# Patient Record
Sex: Male | Born: 1935 | Race: White | Hispanic: No | Marital: Married | Smoking: Never smoker
Health system: Southern US, Community
[De-identification: ages and names within clinical notes are randomized; demographics above are authoritative.]

## PROBLEM LIST (undated history)

## (undated) DIAGNOSIS — F329 Major depressive disorder, single episode, unspecified: Secondary | ICD-10-CM

## (undated) DIAGNOSIS — I2510 Atherosclerotic heart disease of native coronary artery without angina pectoris: Secondary | ICD-10-CM

## (undated) DIAGNOSIS — R350 Frequency of micturition: Secondary | ICD-10-CM

## (undated) DIAGNOSIS — J189 Pneumonia, unspecified organism: Secondary | ICD-10-CM

## (undated) DIAGNOSIS — Z8709 Personal history of other diseases of the respiratory system: Secondary | ICD-10-CM

## (undated) DIAGNOSIS — D649 Anemia, unspecified: Secondary | ICD-10-CM

## (undated) DIAGNOSIS — E785 Hyperlipidemia, unspecified: Secondary | ICD-10-CM

## (undated) DIAGNOSIS — F32A Depression, unspecified: Secondary | ICD-10-CM

## (undated) HISTORY — PX: EYE SURGERY: SHX253

## (undated) HISTORY — PX: TONSILLECTOMY: SUR1361

## (undated) HISTORY — DX: Hyperlipidemia, unspecified: E78.5

## (undated) HISTORY — PX: CARDIAC CATHETERIZATION: SHX172

## (undated) HISTORY — DX: Anemia, unspecified: D64.9

## (undated) HISTORY — PX: OTHER SURGICAL HISTORY: SHX169

---

## 2000-04-01 ENCOUNTER — Ambulatory Visit (HOSPITAL_COMMUNITY): Admission: RE | Admit: 2000-04-01 | Discharge: 2000-04-01 | Payer: Self-pay | Admitting: Gastroenterology

## 2003-05-18 ENCOUNTER — Ambulatory Visit (HOSPITAL_COMMUNITY): Admission: RE | Admit: 2003-05-18 | Discharge: 2003-05-18 | Payer: Self-pay | Admitting: Gastroenterology

## 2004-05-07 ENCOUNTER — Inpatient Hospital Stay (HOSPITAL_COMMUNITY): Admission: RE | Admit: 2004-05-07 | Discharge: 2004-05-08 | Payer: Self-pay | Admitting: Orthopedic Surgery

## 2011-02-16 ENCOUNTER — Other Ambulatory Visit: Payer: Self-pay | Admitting: Dermatology

## 2011-03-05 ENCOUNTER — Other Ambulatory Visit: Payer: Self-pay | Admitting: Dermatology

## 2011-05-19 ENCOUNTER — Other Ambulatory Visit: Payer: Self-pay | Admitting: Interventional Cardiology

## 2011-05-21 ENCOUNTER — Encounter (HOSPITAL_BASED_OUTPATIENT_CLINIC_OR_DEPARTMENT_OTHER)
Admission: RE | Disposition: A | Discharge status: Hospice | Payer: Self-pay | Source: Ambulatory Visit | Attending: Interventional Cardiology

## 2011-05-21 ENCOUNTER — Inpatient Hospital Stay (HOSPITAL_BASED_OUTPATIENT_CLINIC_OR_DEPARTMENT_OTHER)
Admission: RE | Admit: 2011-05-21 | Discharge: 2011-05-21 | Disposition: A | Discharge status: Hospice | Payer: Medicare Other | Source: Ambulatory Visit | Attending: Interventional Cardiology | Admitting: Interventional Cardiology

## 2011-05-21 ENCOUNTER — Inpatient Hospital Stay (HOSPITAL_COMMUNITY): Payer: Medicare Other

## 2011-05-21 ENCOUNTER — Encounter (HOSPITAL_COMMUNITY): Payer: Self-pay | Admitting: *Deleted

## 2011-05-21 ENCOUNTER — Other Ambulatory Visit: Payer: Self-pay

## 2011-05-21 ENCOUNTER — Inpatient Hospital Stay (HOSPITAL_COMMUNITY)
Admission: AD | Admit: 2011-05-21 | Discharge: 2011-05-27 | DRG: 236 | Disposition: A | Discharge status: Home / Self Care | Payer: Medicare Other | Source: Ambulatory Visit | Attending: Cardiothoracic Surgery | Admitting: Cardiothoracic Surgery

## 2011-05-21 DIAGNOSIS — Z79899 Other long term (current) drug therapy: Secondary | ICD-10-CM

## 2011-05-21 DIAGNOSIS — E8779 Other fluid overload: Secondary | ICD-10-CM | POA: Diagnosis not present

## 2011-05-21 DIAGNOSIS — E7800 Pure hypercholesterolemia, unspecified: Secondary | ICD-10-CM | POA: Diagnosis present

## 2011-05-21 DIAGNOSIS — I209 Angina pectoris, unspecified: Secondary | ICD-10-CM | POA: Diagnosis present

## 2011-05-21 DIAGNOSIS — N400 Enlarged prostate without lower urinary tract symptoms: Secondary | ICD-10-CM | POA: Diagnosis present

## 2011-05-21 DIAGNOSIS — E871 Hypo-osmolality and hyponatremia: Secondary | ICD-10-CM | POA: Diagnosis present

## 2011-05-21 DIAGNOSIS — D696 Thrombocytopenia, unspecified: Secondary | ICD-10-CM | POA: Diagnosis not present

## 2011-05-21 DIAGNOSIS — I2510 Atherosclerotic heart disease of native coronary artery without angina pectoris: Secondary | ICD-10-CM | POA: Insufficient documentation

## 2011-05-21 DIAGNOSIS — F3289 Other specified depressive episodes: Secondary | ICD-10-CM | POA: Diagnosis present

## 2011-05-21 DIAGNOSIS — D62 Acute posthemorrhagic anemia: Secondary | ICD-10-CM | POA: Diagnosis not present

## 2011-05-21 DIAGNOSIS — N529 Male erectile dysfunction, unspecified: Secondary | ICD-10-CM | POA: Diagnosis present

## 2011-05-21 DIAGNOSIS — Z01810 Encounter for preprocedural cardiovascular examination: Secondary | ICD-10-CM

## 2011-05-21 DIAGNOSIS — F329 Major depressive disorder, single episode, unspecified: Secondary | ICD-10-CM | POA: Diagnosis present

## 2011-05-21 DIAGNOSIS — I2584 Coronary atherosclerosis due to calcified coronary lesion: Secondary | ICD-10-CM | POA: Insufficient documentation

## 2011-05-21 DIAGNOSIS — Z7982 Long term (current) use of aspirin: Secondary | ICD-10-CM

## 2011-05-21 HISTORY — DX: Atherosclerotic heart disease of native coronary artery without angina pectoris: I25.10

## 2011-05-21 HISTORY — DX: Major depressive disorder, single episode, unspecified: F32.9

## 2011-05-21 HISTORY — DX: Anemia, unspecified: D64.9

## 2011-05-21 HISTORY — DX: Depression, unspecified: F32.A

## 2011-05-21 LAB — ABO/RH: ABO/RH(D): A POS

## 2011-05-21 SURGERY — JV LEFT HEART CATHETERIZATION WITH CORONARY ANGIOGRAM
Anesthesia: Moderate Sedation

## 2011-05-21 MED ORDER — PHENYLEPHRINE HCL 10 MG/ML IJ SOLN
30.0000 ug/min | INTRAMUSCULAR | Status: DC
  Filled 2011-05-21: qty 2

## 2011-05-21 MED ORDER — SODIUM CHLORIDE 0.9 % IV SOLN
1250.0000 mg | INTRAVENOUS | Status: AC
  Administered 2011-05-22: 1000 mg via INTRAVENOUS
  Filled 2011-05-21: qty 1250

## 2011-05-21 MED ORDER — ALPRAZOLAM 0.25 MG PO TABS: 0.2500 mg | ORAL_TABLET | ORAL | Status: DC | PRN

## 2011-05-21 MED ORDER — SODIUM CHLORIDE 0.9 % IV SOLN: INTRAVENOUS | Status: DC

## 2011-05-21 MED ORDER — ASPIRIN 81 MG PO CHEW: 81.0000 mg | CHEWABLE_TABLET | Freq: Every day | ORAL | Status: DC

## 2011-05-21 MED ORDER — CHLORHEXIDINE GLUCONATE 4 % EX LIQD
60.0000 mL | Freq: Once | CUTANEOUS | Status: AC
  Administered 2011-05-21: 4 via TOPICAL

## 2011-05-21 MED ORDER — ACETAMINOPHEN 325 MG PO TABS: 650.0000 mg | ORAL_TABLET | ORAL | Status: DC | PRN

## 2011-05-21 MED ORDER — POTASSIUM CHLORIDE 2 MEQ/ML IV SOLN
80.0000 meq | INTRAVENOUS | Status: DC
  Filled 2011-05-21: qty 40

## 2011-05-21 MED ORDER — ALPRAZOLAM 0.25 MG PO TABS: 0.2500 mg | ORAL_TABLET | Freq: Two times a day (BID) | ORAL | Status: DC | PRN

## 2011-05-21 MED ORDER — DIAZEPAM 5 MG PO TABS
5.0000 mg | ORAL_TABLET | ORAL | Status: AC
  Administered 2011-05-21: 5 mg via ORAL

## 2011-05-21 MED ORDER — DOPAMINE-DEXTROSE 3.2-5 MG/ML-% IV SOLN: 2.0000 ug/kg/min | INTRAVENOUS | Status: DC

## 2011-05-21 MED ORDER — SODIUM CHLORIDE 0.9 % IV SOLN: 1.0000 mL/kg/h | INTRAVENOUS | Status: DC

## 2011-05-21 MED ORDER — OMEGA-3 FATTY ACIDS 1000 MG PO CAPS
1.0000 g | ORAL_CAPSULE | Freq: Every day | ORAL | Status: DC
  Filled 2011-05-21: qty 1

## 2011-05-21 MED ORDER — NITROGLYCERIN IN D5W 200-5 MCG/ML-% IV SOLN
2.0000 ug/min | INTRAVENOUS | Status: DC
  Filled 2011-05-21: qty 250

## 2011-05-21 MED ORDER — ONDANSETRON HCL 4 MG/2ML IJ SOLN: 4.0000 mg | Freq: Four times a day (QID) | INTRAMUSCULAR | Status: DC | PRN

## 2011-05-21 MED ORDER — DOPAMINE-DEXTROSE 3.2-5 MG/ML-% IV SOLN
2.0000 ug/kg/min | INTRAVENOUS | Status: DC
  Filled 2011-05-21 (×3): qty 250

## 2011-05-21 MED ORDER — EPINEPHRINE HCL 1 MG/ML IJ SOLN
0.5000 ug/min | INTRAVENOUS | Status: DC
  Filled 2011-05-21: qty 4

## 2011-05-21 MED ORDER — CHLORHEXIDINE GLUCONATE 4 % EX LIQD
60.0000 mL | Freq: Once | CUTANEOUS | Status: DC
  Filled 2011-05-21: qty 60

## 2011-05-21 MED ORDER — SODIUM CHLORIDE 0.9 % IJ SOLN: 3.0000 mL | Freq: Two times a day (BID) | INTRAMUSCULAR | Status: DC

## 2011-05-21 MED ORDER — TEMAZEPAM 15 MG PO CAPS
15.0000 mg | ORAL_CAPSULE | Freq: Once | ORAL | Status: AC | PRN
  Administered 2011-05-21: 15 mg via ORAL
  Filled 2011-05-21: qty 1

## 2011-05-21 MED ORDER — FOLIC ACID 1 MG PO TABS
1.0000 mg | ORAL_TABLET | Freq: Every day | ORAL | Status: DC
  Administered 2011-05-23 – 2011-05-27 (×5): 1 mg via ORAL
  Filled 2011-05-21 (×6): qty 1

## 2011-05-21 MED ORDER — METOPROLOL TARTRATE 12.5 MG HALF TABLET
12.5000 mg | ORAL_TABLET | Freq: Once | ORAL | Status: AC
  Administered 2011-05-22: 12.5 mg via ORAL
  Filled 2011-05-21 (×2): qty 1

## 2011-05-21 MED ORDER — SODIUM CHLORIDE 0.9 % IV SOLN: 0.1000 ug/kg/h | INTRAVENOUS | Status: DC

## 2011-05-21 MED ORDER — SODIUM CHLORIDE 0.9 % IJ SOLN: 3.0000 mL | INTRAMUSCULAR | Status: DC | PRN

## 2011-05-21 MED ORDER — NITROGLYCERIN 0.4 MG SL SUBL: 0.4000 mg | SUBLINGUAL_TABLET | SUBLINGUAL | Status: DC | PRN

## 2011-05-21 MED ORDER — MAGNESIUM SULFATE 50 % IJ SOLN
40.0000 meq | INTRAMUSCULAR | Status: DC
  Filled 2011-05-21: qty 10

## 2011-05-21 MED ORDER — PLASMA-LYTE 148 IV SOLN
INTRAVENOUS | Status: AC
  Administered 2011-05-22: 09:00:00
  Filled 2011-05-21: qty 0.5

## 2011-05-21 MED ORDER — SODIUM CHLORIDE 0.9 % IV SOLN
INTRAVENOUS | Status: DC
  Filled 2011-05-21: qty 1

## 2011-05-21 MED ORDER — CHLORHEXIDINE GLUCONATE 4 % EX LIQD
60.0000 mL | Freq: Once | CUTANEOUS | Status: AC
  Administered 2011-05-22: 4 via TOPICAL
  Filled 2011-05-21: qty 60

## 2011-05-21 MED ORDER — FLUOXETINE HCL 20 MG PO CAPS
20.0000 mg | ORAL_CAPSULE | Freq: Every day | ORAL | Status: DC
  Administered 2011-05-23 – 2011-05-27 (×5): 20 mg via ORAL
  Filled 2011-05-21 (×6): qty 1

## 2011-05-21 MED ORDER — ASPIRIN 81 MG PO CHEW
324.0000 mg | CHEWABLE_TABLET | ORAL | Status: AC
  Administered 2011-05-21: 324 mg via ORAL

## 2011-05-21 MED ORDER — MORPHINE SULFATE 2 MG/ML IJ SOLN: 1.0000 mg | INTRAMUSCULAR | Status: DC | PRN

## 2011-05-21 MED ORDER — BISACODYL 5 MG PO TBEC
5.0000 mg | DELAYED_RELEASE_TABLET | Freq: Once | ORAL | Status: DC
  Filled 2011-05-21: qty 1

## 2011-05-21 MED ORDER — ASPIRIN EC 81 MG PO TBEC
81.0000 mg | DELAYED_RELEASE_TABLET | Freq: Every day | ORAL | Status: DC
  Filled 2011-05-21: qty 1

## 2011-05-21 MED ORDER — MOXIFLOXACIN HCL IN NACL 400 MG/250ML IV SOLN
400.0000 mg | INTRAVENOUS | Status: AC
Start: 2011-05-22 — End: 2011-05-22
  Administered 2011-05-22: 400 mg via INTRAVENOUS
  Filled 2011-05-21: qty 250

## 2011-05-21 MED ORDER — SODIUM CHLORIDE 0.9 % IV SOLN
0.1000 ug/kg/h | INTRAVENOUS | Status: DC
  Filled 2011-05-21: qty 4

## 2011-05-21 MED ORDER — SODIUM CHLORIDE 0.9 % IV SOLN
INTRAVENOUS | Status: DC
  Filled 2011-05-21: qty 40

## 2011-05-21 MED ORDER — TAMSULOSIN HCL 0.4 MG PO CAPS
0.4000 mg | ORAL_CAPSULE | Freq: Every day | ORAL | Status: DC
  Filled 2011-05-21 (×2): qty 1

## 2011-05-21 MED ORDER — SODIUM CHLORIDE 0.9 % IV SOLN: 250.0000 mL | INTRAVENOUS | Status: DC | PRN

## 2011-05-21 MED ORDER — ROSUVASTATIN CALCIUM 10 MG PO TABS
10.0000 mg | ORAL_TABLET | Freq: Every day | ORAL | Status: DC
  Administered 2011-05-21 – 2011-05-26 (×5): 10 mg via ORAL
  Filled 2011-05-21 (×8): qty 1

## 2011-05-21 NOTE — Op Note (Addendum)
PROCEDURE:  Left heart catheterization with selective coronary angiography, left ventriculogram, abdominal aortogram.  INDICATIONS:    The risks, benefits, and details of the procedure were explained to the patient.  The patient verbalized understanding and wanted to proceed.  Informed written consent was obtained.  PROCEDURE TECHNIQUE:  After Xylocaine anesthesia a 73F sheath was placed in the right femoral artery with a single anterior needle wall stick.   Left coronary angiography was done using a Judkins L4 guide catheter.  Right coronary angiography was done using a Judkins R4 guide catheter.  Left ventriculography was done using a pigtail catheter. The pigtail catheter was withdrawn to the abdominal aorta and a power injection of contrast was performed   CONTRAST:  Total of 75 cc.  COMPLICATIONS:  None.    HEMODYNAMICS:  Aortic pressure was 130/65, mean aortic pressure 92; LV pressure was 132/8; LVEDP 13.  There was no gradient between the left ventricle and aorta.    ANGIOGRAPHIC DATA:   The left main coronary artery is heavily calcified. There is a distal 60% stenosis.  The left anterior descending artery is a large vessel. There is an ostial 70% stenosis which is also heavily calcified. The entire proximal vessel is very heavily calcified.  Just before the first diagonal, there is a 60% calcified stenosis. There 2 medium-sized diagonals both of which had severe ostial to proximal disease. In the mid LAD, there are sequential 80% stenoses separated by a short more patent segment. The distal LAD appears widely patent.  The left circumflex artery is a large vessel. There is heavy calcification in the ostial to proximal section. There is a 95% stenosis which starts right at the distal left main.  There is a medium-sized OM 1 which is patent.  There is a 75% stenosis just after the OM1. There is a large branching second obtuse marginal. This vessel appears widely patent.  The right coronary  artery is a large dominant vessel which is diffusely calcified. There is a 25% distal stenosis. There is no flow limiting disease in the right coronary artery.  LEFT VENTRICULOGRAM:  Left ventricular angiogram was done in the 30 RAO projection and revealed normal left ventricular wall motion and systolic function with an estimated ejection fraction of 60 %.  LVEDP was  13 mmHg.  ABDOMINAL AORTOGRAM: No abdominal aortic aneurysm. Dual renal arterial supply to both kidneys.  No significant aortoiliac disease.  IMPRESSIONS:  1. Significant, calcific distal left main coronary artery disease. 2. Heavily calcified left anterior descending proximally with a 70% ostial stenosis, and 80% mid vessel stenoses. Diffuse disease noted in both diagonal vessels. 3. Heavily calcified ostial circumflex with 95% stenosis. Patent obtuse marginal vessels. 4. Mild disease in right coronary artery. 5. Normal left ventricular systolic function.  LVEDP 60 mmHg.  Ejection fraction 13 %. 6.  No abdominal aortic aneurysm. No renal artery stenosis.  RECOMMENDATION:  The patient will be admitted and evaluated by cardiothoracic surgery for possible bypass surgery. Will start statin along with other secondary prevention.  CC: Dr. Benjaman Kindler.

## 2011-05-21 NOTE — OR Nursing (Signed)
Dr Varanasi at bedside to discuss results and treatment plan with pt and family 

## 2011-05-21 NOTE — Progress Notes (Addendum)
*  PRELIMINARY RESULTS*  Carotid Doppler has been performed. Bilateral:  No evidence of hemodynamically significant internal carotid artery stenosis.  Vertebral artery flow is antegrade.       Farrel Demark 05/21/2011, 2:49 PM  Palpable pedal pulses Right BP  Leftp BP 143mm/Hg  Bilateral brachial, radial and ulnar waveforms triphasic.  Bilateral palmar arch studies within normal limits.  Terance Hart 3:54 PM  05/21/2011

## 2011-05-21 NOTE — Anesthesia Preprocedure Evaluation (Addendum)
Anesthesia Evaluation  Patient identified by MRN, date of birth, ID band Patient awake    Reviewed: Allergy & Precautions, H&P , NPO status , Patient's Chart, lab work & pertinent test results, reviewed documented beta blocker date and time   Airway Mallampati: II TM Distance: >3 FB Neck ROM: Full    Dental  (+) Teeth Intact   Pulmonary neg pulmonary ROS,    Pulmonary exam normal       Cardiovascular + angina + CAD + Valvular Problems/Murmurs (Pt unsure, told he had a heart murmur as a child after rheumatic fever) regular Normal    Neuro/Psych PSYCHIATRIC DISORDERS (Depression) Depression Negative Neurological ROS     GI/Hepatic negative GI ROS, Neg liver ROS,   Endo/Other  Negative Endocrine ROS  Renal/GU negative Renal ROS  Genitourinary negative   Musculoskeletal   Abdominal   Peds  Hematology negative hematology ROS (+)   Anesthesia Other Findings   Reproductive/Obstetrics                         Anesthesia Physical Anesthesia Plan  ASA: III  Anesthesia Plan: General ETT and General   Post-op Pain Management:    Induction: Intravenous  Airway Management Planned: Oral ETT  Additional Equipment: Arterial line, CVP and PA Cath  Intra-op Plan:   Post-operative Plan: Post-operative intubation/ventilation  Informed Consent: I have reviewed the patients History and Physical, chart, labs and discussed the procedure including the risks, benefits and alternatives for the proposed anesthesia with the patient or authorized representative who has indicated his/her understanding and acceptance.     Plan Discussed with: Anesthesiologist, CRNA and Surgeon  Anesthesia Plan Comments:         Anesthesia Quick Evaluation

## 2011-05-21 NOTE — Progress Notes (Signed)
Upper extremity Dopplers for pre CABG completed at 16:00 by HC. Smiley Houseman 05/21/2011, 4:28 PM

## 2011-05-21 NOTE — OR Nursing (Signed)
Tegaderm dressing applied, site intact, level 0, bedrest begins at 1200.

## 2011-05-21 NOTE — Consult Note (Signed)
[address]            [place] [address]          [phone]       Charles Grant [facility] Medical Record #[id number] Date of Birth: July 09, 1933  Referring: Dr Everette Rank Primary Care: Theressa Millard  Chief Complaint:   Exertional Chest Pain   History of Present Illness:     Patient with no  previous history of mi or coronary disease. Feb of this year began having episodes of upper chest neck discomfort, worse with exertion, relief with rest.    Current Activity/ Functional Status: Patient  Is  independent with mobility/ambulation, transfers, ADL's, IADL's.   Past Medical History  Diagnosis Date  . Angina Feb 2012  . Heart murmur   . Depression   . Anemia   . Coronary artery disease 05/21/2011    Past Surgical History  Procedure Date  . Tonsillectomy   . Cardiac catheterization        Rt Rotator Cuff repair      Pre melanoma rt abdominal wall 08/2010 unknown final path/stage    History  Smoking status  . Not on file  Smokeless tobacco  . Never Used    History  Alcohol Use  . 6.0 oz/week  . 10 Glasses of wine per week    History   Social History  . Marital Status: Married    Spouse Name:     Number of Children:   . Years of Education:    Occupational History  . [employer] business   Social History Main Topics  . Smoking status: NoT  smoker  . Smokeless tobacco: Never Used  . Alcohol Use: 6.0 oz/week    10 Glasses of wine per week  . Drug Use: No  . Sexually Active: Yes    Social History Narrative  . No narrative on file    Allergies  Allergen Reactions  . Penicillins Hives, Swelling and Rash  . Singulair Other (See Comments)    When mixed with antidepressants there is a side effect    Current Facility-Administered Medications  Medication Dose Route Frequency Provider Last Rate Last Dose  . rosuvastatin (CRESTOR) tablet 10 mg  10 mg Oral q1800 Corky Crafts       Facility-Administered  Medications Ordered in Other Encounters  Medication Dose Route Frequency Provider Last Rate Last Dose  . aspirin chewable tablet 324 mg  324 mg Oral Pre-Cath Jayadeep S. Varanasi   324 mg at 05/21/11 1003  . diazepam (VALIUM) tablet 5 mg  5 mg Oral On Call Donnie Coffin. Varanasi   5 mg at 05/21/11 1003  . DISCONTD: 0.9 %  sodium chloride infusion   Intravenous Continuous Corky Crafts      . DISCONTD: 0.9 %  sodium chloride infusion  250 mL Intravenous PRN Corky Crafts      . DISCONTD: 0.9 %  sodium chloride infusion  1 mL/kg/hr Intravenous Continuous Corky Crafts      . DISCONTD: acetaminophen (TYLENOL) tablet 650 mg  650 mg Oral Q4H PRN Corky Crafts      . DISCONTD: acetaminophen (TYLENOL) tablet 650 mg  650 mg Oral Q4H PRN Corky Crafts      . DISCONTD: aspirin chewable tablet 81 mg  81 mg Oral Daily Corky Crafts      .  DISCONTD: morphine 2 MG/ML injection 1 mg  1 mg Intravenous Q1H PRN Corky Crafts      . DISCONTD: ondansetron (ZOFRAN) injection 4 mg  4 mg Intravenous Q6H PRN Corky Crafts      . DISCONTD: ondansetron (ZOFRAN) injection 4 mg  4 mg Intravenous Q6H PRN Corky Crafts      . DISCONTD: sodium chloride 0.9 % injection 3 mL  3 mL Intravenous Q12H Corky Crafts      . DISCONTD: sodium chloride 0.9 % injection 3 mL  3 mL Intravenous PRN Corky Crafts        Prescriptions prior to admission  Medication Sig Dispense Refill  . aspirin EC 81 MG tablet Take 81 mg by mouth daily.        . fish oil-omega-3 fatty acids 1000 MG capsule Take 1 g by mouth daily.        Marland Kitchen FLUoxetine (PROZAC) 20 MG capsule Take 20 mg by mouth daily.        . folic acid (FOLVITE) 1 MG tablet Take 1 mg by mouth daily.        . Tamsulosin HCl (FLOMAX) 0.4 MG CAPS Take 0.4 mg by mouth daily after breakfast.           Family History:fahter deceased pancreatic ca                         Mother deceased hypertension 48  Review of  Systems:     Cardiac Review of Systems: Y or N  Chest Pain [  y  ]  Resting SOB [n   ] Exertional SOB  [n  ]  Orthopnea [ n ]   Pedal Edema [n   ]    Palpitations [ n ] Syncope  [ n ]   Presyncope [ n  ]  General Review of Systems: [Y] = yes [  ]=no Constitional: recent weight change [ n ]; anorexia [  ]; fatigue [  ]; nausea [ n ]; night sweats [ n ]; fever [  n]; or chills [ n ];                                                                                                                                          Dental: poor dentition[ n ];  Eye : blurred vision [  ]; diplopia [   ]; vision changes [  ];  Amaurosis fugax[  ]; Resp: cough [  ];  wheezing[  ];  hemoptysis[  ]; shortness of breath[  ]; paroxysmal nocturnal dyspnea[  ]; dyspnea on exertion[  ]; or orthopnea[  ];  GI:  gallstones[  ], vomiting[  ];  dysphagia[  ]; melena[  ];  hematochezia [  ]; heartburn[  ];   Hx of  Colonoscopy[  ]; GU: kidney  stones [  ]; hematuria[  ];   dysuria [  ];  nocturia[  ];  history of     obstruction [ y ];             Skin: rash, swelling[  ];, hair loss[  ];  peripheral edema[  ];  or itching[  ]; Musculosketetal: myalgias[  ];  joint swelling[  ];  joint erythema[  ];  joint pain[  ];  back pain[  ];  Heme/Lymph: bruising[  ];  bleeding[  ];  anemia[  ];  Neuro: TIA[  ];  headaches[  ];  stroke[  ];  vertigo[  ];  seizures[  ];   paresthesias[  ];  difficulty walking[  ];  Psych:depression[  ]; anxiety[  ];  Endocrine: diabetes[  ];  thyroid dysfunction[  ];  Immunizations: Flu [  ]; Pneumococcal[  ];  Other: negative  Physical Exam: BP 133/79  Pulse 62  Temp 97.9 F (36.6 C)  Resp 16  SpO2 98%  General appearance: alert, cooperative, appears stated age and no distress Neurologic: intact Heart: regular rate and rhythm, S1, S2 normal, no murmur, click, rub or gallop Lungs: clear to auscultation bilaterally Abdomen: soft, non-tender; bowel sounds normal; no masses,  no  organomegaly Extremities: extremities normal, atraumatic, no cyanosis or edema, Homans sign is negative, no sign of DVT, no edema, redness or tenderness in the calves or thighs and no ulcers, gangrene or trophic changes rt cath site with out hematoma No carotid bruits, no palpable AAA Veins in lower leg ok for bypass  Diagnostic Studies & Laboratory data:     Recent Radiology Findings:   No results found.    Recent Lab Findings: No results found for this basename: WBC, HGB, HCT, PLT, GLUCOSE, CHOL, TRIG, HDL, LDLDIRECT, LDLCALC, ALT, AST, NA, K, CL, CREATININE, BUN, CO2, TSH, INR, GLUF, HGBA1C   PROCEDURE: Left heart catheterization with selective coronary angiography, left ventriculogram, abdominal aortogram.  INDICATIONS:  The risks, benefits, and details of the procedure were explained to the patient. The patient verbalized understanding and wanted to proceed. Informed written consent was obtained.  PROCEDURE TECHNIQUE: After Xylocaine anesthesia a 35F sheath was placed in the right femoral artery with a single anterior needle wall stick. Left coronary angiography was done using a Judkins L4 guide catheter. Right coronary angiography was done using a Judkins R4 guide catheter. Left ventriculography was done using a pigtail catheter. The pigtail catheter was withdrawn to the abdominal aorta and a power injection of contrast was performed  CONTRAST: Total of 75 cc.  COMPLICATIONS: None.  HEMODYNAMICS: Aortic pressure was 130/65, mean aortic pressure 92; LV pressure was 132/8; LVEDP 13. There was no gradient between the left ventricle and aorta.  ANGIOGRAPHIC DATA: The left main coronary artery is heavily calcified. There is a distal 60% stenosis.  The left anterior descending artery is a large vessel. There is an ostial 70% stenosis which is also heavily calcified. The entire proximal vessel is very heavily calcified. Just before the first diagonal, there is a 60% calcified stenosis. There 2  medium-sized diagonals both of which had severe ostial to proximal disease. In the mid LAD, there are sequential 80% stenoses separated by a short more patent segment. The distal LAD appears widely patent.  The left circumflex artery is a large vessel. There is heavy calcification in the ostial to proximal section. There is a 95% stenosis which starts right at the distal left main. There is a  medium-sized OM 1 which is patent. There is a 75% stenosis just after the OM1. There is a large branching second obtuse marginal. This vessel appears widely patent.  The right coronary artery is a large dominant vessel which is diffusely calcified. There is a 25% distal stenosis. There is no flow limiting disease in the right coronary artery.  LEFT VENTRICULOGRAM: Left ventricular angiogram was done in the 30 RAO projection and revealed normal left ventricular wall motion and systolic function with an estimated ejection fraction of 60 %. LVEDP was 13 mmHg.  ABDOMINAL AORTOGRAM: No abdominal aortic aneurysm. Dual renal arterial supply to both kidneys. No significant aortoiliac disease.  IMPRESSIONS:  1. Significant, calcific distal left main coronary artery disease. 2. Heavily calcified left anterior descending proximally with a 70% ostial stenosis, and 80% mid vessel stenoses. Diffuse disease noted in both diagonal vessels. 3. Heavily calcified ostial circumflex with 95% stenosis. Patent obtuse marginal vessels. 4. Mild disease in right coronary artery. 5. Normal left ventricular systolic function. LVEDP 60 mmHg. Ejection fraction 13 %. 6. No abdominal aortic aneurysm. No renal artery stenosis.  RECOMMENDATION: The patient will be admitted and evaluated by cardiothoracic surgery for possible bypass surgery. Will start statin along with other secondary prevention.   Correction on my review lv function is normal not 13%    Assessment / Plan:     Coronary artery disease symptomatic with left main disease. Agree  with cardiology CABG is best treatment option The goals risks and alternatives of the planned surgical procedure CABG have been discussed with the patient in detail. The risks of the procedure including death, infection, stroke, myocardial infarction, bleeding, blood transfusion have all been discussed specifically.  I have quoted Charles Grant a 2 % of perioperative mortality and a complication rate as high as 20%. The patient's questions have been answered.Charles Grant is willing  to proceed with the planned procedure. Will plan to proceed in am.     Delight Ovens MD  Beeper [phone] Office [phone] 05/21/2011 5:20 PM

## 2011-05-21 NOTE — H&P (Signed)
  Date of Initial H&P: 05/11/11  History reviewed, patient examined, no change in status, stable for cath.

## 2011-05-21 NOTE — OR Nursing (Signed)
Report called to Arnold, RN on 3700, pt transported via stretcher on monitor to 3740 without change.

## 2011-05-21 NOTE — H&P (Signed)
CC: severe CAD           HPI:  General:  77 y/o who has been having rare discomfort in his chest. It started in early 2012. It would be in the mid chest and feel like an iritation. It was associated with SHOB. It occurs with cold weather. It occurs during sex as well. It is relieved with rest. It is mild. 95% of the time, he walks and has no problems. 50% of the time during intercourse, he has some discomfort.   Cath today showed severe Left main, prox LAD and prox circ disease.  Currently no chest pain.       ROS:  FOLLOW-UP ROS:  Gastroenterology: denies nausea/vomiting. General: denies, fever, chills. GYN/GU no dysuria. Neurology: no focal deficits.  fatigue at different times during the year. Occasional anemia. All other systems negative.       Medical History: Prostatism - on tamsulosin, Seasonal depression - meds and bright light, allergic rhinitis - manifested by runny nose, nasal congestion and profound fatigue in Spring and Fall - nasal symptoms helped with antihistamines - has used Kenalog in the past - BMD was fine in 2003 despite use of steroids, LBP, Rheumatic fever - no long term murmur, Shingles (2005), hypercholesterolemia with preserved HDL, mild chronic hyponatremia, ? due to Prozac, colon polyps (2004, 2007 - normal 2010) - repeat 2015 - Dr. Laural Benes, ophth - Dr. Valere Dross, Erectile dysfunction.                   Surgical History: rotator cuff repair , tonsillectomy , vasectomy , reversal of vasectomy , neck lift 10/2005, premelanoma - right abdominal wall 2012.        Family History: Father: deceased 28 yrs pancreatic cancer Mother: deceased 58 yrs hypertension; history of stroke; colon cancer 2 son(s) , 2 daughter(s) .  Significant GI family history: None; no early CAD.       Social History:  General:   cigarettes: no Alcohol: yes, occasionally.  Caffeine: yes.  Diet: largely vegan in the last two months.  Exercise: walking; golf.  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  Marital Status: married.        Medications: Folic Acid 400 MCG Tablet 1 tablet Once a day, Prozac 20 MG Capsule 3 capsules Once a day, Tamsulosin HCl 0.4 MG Capsule TAKE 2 CAPSULES 30 MINUTES AFTER THE SAME MEAL EACH DAY ONCE A DAY ORALLY , Viagra 100 MG Tablet 1 tablet as needed Once a day, Aspirin 81 MG Tablet 1 tablet Once a day, Fish Oil 1000 MG Capsule 1 capsule with a meal Once a day, Medication List reviewed and reconciled with the patient       Allergies: Penicillin (for allergy): rash, Singulair: depression.       Objective:     Vitals: Wt 156, Wt change -6 lb, Ht 67, BMI 24.43, Pulse sitting 88, BP sitting 110/74.       Examination:  General Examination:  GENERAL APPEARANCE alert, oriented, NAD, pleasant.  SKIN: normal, no rash.  HEAD: [place]/AT.  EYES: EOMI, Conjunctiva clear, no drainage.  NECK: supple, no bruits, no JVD.  LUNGS: clear to auscultation bilaterally, no wheezes, rhonchi, rales.  HEART: no murmurs, regular rate and rhythm, normal S1S2.  ABDOMEN: soft and not tender, non-distended, no masses palpated.  BACK: unremarkable.  EXTREMITIES: no edema.  NEUROLOGIC EXAM: non-focal exam.  PERIPHERAL PULSES: normal (2+) bilaterally PT pulses.  PSYCH appropriate mood  and affect .        Assessment:     Assessment:  1. Angina pectoris - 413.9 (Primary), He will need a stress test to stratify his risk  2. Hypercholesteremia, pure - 272.0    Plan:     1. Angina pectoris  Severe CAD. Needs CABG.  CVTS consult.      2. Hypercholesteremia, pure  LDL target should be below 100 at this point. most recent LDL is 111. Starting Crestor.

## 2011-05-21 NOTE — OR Nursing (Signed)
Meal served 

## 2011-05-22 ENCOUNTER — Encounter (HOSPITAL_COMMUNITY): Payer: Self-pay | Admitting: Anesthesiology

## 2011-05-22 ENCOUNTER — Inpatient Hospital Stay (HOSPITAL_COMMUNITY): Payer: Medicare Other

## 2011-05-22 ENCOUNTER — Encounter (HOSPITAL_COMMUNITY)
Admission: AD | Disposition: A | Discharge status: Home / Self Care | Payer: Self-pay | Source: Ambulatory Visit | Attending: Cardiothoracic Surgery

## 2011-05-22 ENCOUNTER — Inpatient Hospital Stay (HOSPITAL_COMMUNITY): Payer: Medicare Other | Admitting: Anesthesiology

## 2011-05-22 ENCOUNTER — Other Ambulatory Visit: Payer: Self-pay

## 2011-05-22 DIAGNOSIS — I2510 Atherosclerotic heart disease of native coronary artery without angina pectoris: Secondary | ICD-10-CM

## 2011-05-22 HISTORY — PX: CORONARY ARTERY BYPASS GRAFT: SHX141

## 2011-05-22 LAB — POCT I-STAT, CHEM 8
BUN: 8 mg/dL (ref 6–23)
Calcium, Ion: 1.29 mmol/L (ref 1.12–1.32)
Chloride: 103 mEq/L (ref 96–112)
Creatinine, Ser: 0.9 mg/dL (ref 0.50–1.35)
Glucose, Bld: 107 mg/dL — ABNORMAL HIGH (ref 70–99)
HCT: 26 % — ABNORMAL LOW (ref 39.0–52.0)
Hemoglobin: 8.8 g/dL — ABNORMAL LOW (ref 13.0–17.0)
Potassium: 4.2 mEq/L (ref 3.5–5.1)
Sodium: 138 mEq/L (ref 135–145)
TCO2: 24 mmol/L (ref 0–100)

## 2011-05-22 LAB — CBC
HCT: 33.6 % — ABNORMAL LOW (ref 39.0–52.0)
HCT: 23.2 % — ABNORMAL LOW (ref 39.0–52.0)
Hemoglobin: 11.7 g/dL — ABNORMAL LOW (ref 13.0–17.0)
Hemoglobin: 8.2 g/dL — ABNORMAL LOW (ref 13.0–17.0)
MCH: 31.5 pg (ref 26.0–34.0)
MCH: 31.8 pg (ref 26.0–34.0)
MCHC: 34.8 g/dL (ref 30.0–36.0)
MCHC: 35.3 g/dL (ref 30.0–36.0)
MCV: 90.6 fL (ref 78.0–100.0)
MCV: 89.3 fL (ref 78.0–100.0)
MCV: 89.9 fL (ref 78.0–100.0)
Platelets: 131 10*3/uL — ABNORMAL LOW (ref 150–400)
Platelets: 76 10*3/uL — ABNORMAL LOW (ref 150–400)
Platelets: 75 10*3/uL — ABNORMAL LOW (ref 150–400)
RBC: 3.71 MIL/uL — ABNORMAL LOW (ref 4.22–5.81)
RBC: 3.18 MIL/uL — ABNORMAL LOW (ref 4.22–5.81)
RBC: 2.58 MIL/uL — ABNORMAL LOW (ref 4.22–5.81)
RDW: 12.3 % (ref 11.5–15.5)
RDW: 13 % (ref 11.5–15.5)
WBC: 4.8 10*3/uL (ref 4.0–10.5)
WBC: 5.6 10*3/uL (ref 4.0–10.5)
WBC: 4.4 10*3/uL (ref 4.0–10.5)

## 2011-05-22 LAB — BASIC METABOLIC PANEL
BUN: 10 mg/dL (ref 6–23)
CO2: 30 mEq/L (ref 19–32)
Calcium: 9.3 mg/dL (ref 8.4–10.5)
Chloride: 101 mEq/L (ref 96–112)
Creatinine, Ser: 0.87 mg/dL (ref 0.50–1.35)
GFR calc Af Amer: >90 mL/min (ref >90)
GFR calc non Af Amer: 82 mL/min — ABNORMAL LOW (ref >90)
Glucose, Bld: 90 mg/dL (ref 70–99)
Potassium: 4.3 mEq/L (ref 3.5–5.1)
Sodium: 137 mEq/L (ref 135–145)

## 2011-05-22 LAB — POCT I-STAT 3, ART BLOOD GAS (G3+)
Acid-base deficit: 2 mmol/L (ref 0.0–2.0)
Acid-base deficit: 2 mmol/L (ref 0.0–2.0)
Bicarbonate: 25.9 mEq/L — ABNORMAL HIGH (ref 20.0–24.0)
Bicarbonate: 23.3 mEq/L (ref 20.0–24.0)
Bicarbonate: 25.9 mEq/L — ABNORMAL HIGH (ref 20.0–24.0)
Bicarbonate: 23.2 mEq/L (ref 20.0–24.0)
O2 Saturation: 99 %
O2 Saturation: 97 %
O2 Saturation: 98 %
O2 Saturation: 98 %
Patient temperature: 33.9
Patient temperature: 37.9
Patient temperature: 38.5
TCO2: 28 mmol/L (ref 0–100)
TCO2: 24 mmol/L (ref 0–100)
TCO2: 27 mmol/L (ref 0–100)
TCO2: 24 mmol/L (ref 0–100)
pCO2 arterial: 60.3 mmHg (ref 35.0–45.0)
pCO2 arterial: 28.8 mmHg — ABNORMAL LOW (ref 35.0–45.0)
pCO2 arterial: 51.9 mmHg — ABNORMAL HIGH (ref 35.0–45.0)
pCO2 arterial: 43.4 mmHg (ref 35.0–45.0)
pH, Arterial: 7.241 — ABNORMAL LOW (ref 7.350–7.450)
pH, Arterial: 7.503 — ABNORMAL HIGH (ref 7.350–7.450)
pH, Arterial: 7.309 — ABNORMAL LOW (ref 7.350–7.450)
pH, Arterial: 7.343 — ABNORMAL LOW (ref 7.350–7.450)
pO2, Arterial: 173 mmHg — ABNORMAL HIGH (ref 80.0–100.0)
pO2, Arterial: 72 mmHg — ABNORMAL LOW (ref 80.0–100.0)
pO2, Arterial: 130 mmHg — ABNORMAL HIGH (ref 80.0–100.0)
pO2, Arterial: 121 mmHg — ABNORMAL HIGH (ref 80.0–100.0)

## 2011-05-22 LAB — POCT I-STAT 4, (NA,K, GLUC, HGB,HCT)
Glucose, Bld: 94 mg/dL (ref 70–99)
Glucose, Bld: 111 mg/dL — ABNORMAL HIGH (ref 70–99)
Glucose, Bld: 87 mg/dL (ref 70–99)
Glucose, Bld: 118 mg/dL — ABNORMAL HIGH (ref 70–99)
Glucose, Bld: 102 mg/dL — ABNORMAL HIGH (ref 70–99)
Glucose, Bld: 76 mg/dL (ref 70–99)
HCT: 28 % — ABNORMAL LOW (ref 39.0–52.0)
HCT: 27 % — ABNORMAL LOW (ref 39.0–52.0)
HCT: 20 % — ABNORMAL LOW (ref 39.0–52.0)
HCT: 26 % — ABNORMAL LOW (ref 39.0–52.0)
HCT: 24 % — ABNORMAL LOW (ref 39.0–52.0)
HCT: 27 % — ABNORMAL LOW (ref 39.0–52.0)
Hemoglobin: 9.5 g/dL — ABNORMAL LOW (ref 13.0–17.0)
Hemoglobin: 9.2 g/dL — ABNORMAL LOW (ref 13.0–17.0)
Hemoglobin: 6.8 g/dL — CL (ref 13.0–17.0)
Hemoglobin: 8.8 g/dL — ABNORMAL LOW (ref 13.0–17.0)
Hemoglobin: 8.2 g/dL — ABNORMAL LOW (ref 13.0–17.0)
Hemoglobin: 9.2 g/dL — ABNORMAL LOW (ref 13.0–17.0)
Potassium: 4.2 mEq/L (ref 3.5–5.1)
Potassium: 4.5 mEq/L (ref 3.5–5.1)
Potassium: 4.6 mEq/L (ref 3.5–5.1)
Potassium: 5.5 mEq/L — ABNORMAL HIGH (ref 3.5–5.1)
Potassium: 4.7 mEq/L (ref 3.5–5.1)
Potassium: 3.4 mEq/L — ABNORMAL LOW (ref 3.5–5.1)
Sodium: 137 mEq/L (ref 135–145)
Sodium: 136 mEq/L (ref 135–145)
Sodium: 133 mEq/L — ABNORMAL LOW (ref 135–145)
Sodium: 134 mEq/L — ABNORMAL LOW (ref 135–145)
Sodium: 131 mEq/L — ABNORMAL LOW (ref 135–145)
Sodium: 136 mEq/L (ref 135–145)

## 2011-05-22 LAB — APTT
aPTT: 28 seconds (ref 24–37)
aPTT: 35 seconds (ref 24–37)

## 2011-05-22 LAB — PROTIME-INR
INR: 1.11 (ref 0.00–1.49)
INR: 1.46 (ref 0.00–1.49)

## 2011-05-22 LAB — HEMOGLOBIN AND HEMATOCRIT, BLOOD
HCT: 25.6 % — ABNORMAL LOW (ref 39.0–52.0)
Hemoglobin: 9.2 g/dL — ABNORMAL LOW (ref 13.0–17.0)

## 2011-05-22 LAB — GLUCOSE, CAPILLARY
Glucose-Capillary: 123 mg/dL — ABNORMAL HIGH (ref 70–99)
Glucose-Capillary: 111 mg/dL — ABNORMAL HIGH (ref 70–99)

## 2011-05-22 LAB — CREATININE, SERUM
Creatinine, Ser: 0.79 mg/dL (ref 0.50–1.35)
GFR calc Af Amer: >90 mL/min (ref >90)
GFR calc non Af Amer: 86 mL/min — ABNORMAL LOW (ref >90)

## 2011-05-22 LAB — BLOOD GAS, ARTERIAL
Bicarbonate: 27.3 mEq/L — ABNORMAL HIGH (ref 20.0–24.0)
TCO2: 28.7 mmol/L (ref 0–100)
pCO2 arterial: 46 mmHg — ABNORMAL HIGH (ref 35.0–45.0)
pH, Arterial: 7.391 (ref 7.350–7.450)
pO2, Arterial: 77.7 mmHg — ABNORMAL LOW (ref 80.0–100.0)

## 2011-05-22 LAB — URINALYSIS, ROUTINE W REFLEX MICROSCOPIC
Ketones, ur: NEGATIVE mg/dL
Leukocytes, UA: NEGATIVE
Nitrite: NEGATIVE
Protein, ur: NEGATIVE mg/dL

## 2011-05-22 LAB — PLATELET COUNT: Platelets: 87 10*3/uL — ABNORMAL LOW (ref 150–400)

## 2011-05-22 LAB — MAGNESIUM: Magnesium: 2.6 mg/dL — ABNORMAL HIGH (ref 1.5–2.5)

## 2011-05-22 SURGERY — CORONARY ARTERY BYPASS GRAFTING (CABG)
Anesthesia: General | Site: Chest | Wound class: Clean

## 2011-05-22 MED ORDER — PANTOPRAZOLE SODIUM 40 MG PO TBEC
40.0000 mg | DELAYED_RELEASE_TABLET | Freq: Every day | ORAL | Status: DC
  Administered 2011-05-24: 40 mg via ORAL
  Filled 2011-05-22: qty 1

## 2011-05-22 MED ORDER — BISACODYL 5 MG PO TBEC
10.0000 mg | DELAYED_RELEASE_TABLET | Freq: Every day | ORAL | Status: DC
  Administered 2011-05-23 – 2011-05-24 (×2): 10 mg via ORAL
  Filled 2011-05-22: qty 2

## 2011-05-22 MED ORDER — ALBUMIN HUMAN 5 % IV SOLN
250.0000 mL | INTRAVENOUS | Status: AC | PRN
  Administered 2011-05-22 (×4): 250 mL via INTRAVENOUS
  Filled 2011-05-22 (×3): qty 250
  Filled 2011-05-22: qty 500

## 2011-05-22 MED ORDER — SODIUM CHLORIDE 0.9 % IV SOLN
250.0000 mL | INTRAVENOUS | Status: DC
  Administered 2011-05-23: 250 mL via INTRAVENOUS

## 2011-05-22 MED ORDER — NITROGLYCERIN IN D5W 200-5 MCG/ML-% IV SOLN
INTRAVENOUS | Status: DC | PRN
  Administered 2011-05-22: 5 ug/kg/min via INTRAVENOUS

## 2011-05-22 MED ORDER — MAGNESIUM SULFATE 40 MG/ML IJ SOLN
INTRAMUSCULAR | Status: AC
  Filled 2011-05-22: qty 100

## 2011-05-22 MED ORDER — DOCUSATE SODIUM 100 MG PO CAPS
200.0000 mg | ORAL_CAPSULE | Freq: Every day | ORAL | Status: DC
  Administered 2011-05-23 – 2011-05-24 (×2): 200 mg via ORAL
  Filled 2011-05-22 (×2): qty 2

## 2011-05-22 MED ORDER — PROPOFOL 10 MG/ML IV EMUL
INTRAVENOUS | Status: DC | PRN
  Administered 2011-05-22: 50 mg via INTRAVENOUS

## 2011-05-22 MED ORDER — MORPHINE SULFATE 4 MG/ML IJ SOLN
2.0000 mg | INTRAMUSCULAR | Status: DC | PRN
  Administered 2011-05-22: 4 mg via INTRAVENOUS
  Filled 2011-05-22: qty 1

## 2011-05-22 MED ORDER — ALBUMIN HUMAN 5 % IV SOLN
250.0000 mL | INTRAVENOUS | Status: AC | PRN
  Administered 2011-05-23: 250 mL via INTRAVENOUS
  Filled 2011-05-22: qty 250

## 2011-05-22 MED ORDER — ROCURONIUM BROMIDE 100 MG/10ML IV SOLN
INTRAVENOUS | Status: DC | PRN
  Administered 2011-05-22: 20 mg via INTRAVENOUS
  Administered 2011-05-22 (×3): 50 mg via INTRAVENOUS

## 2011-05-22 MED ORDER — ASPIRIN EC 325 MG PO TBEC
325.0000 mg | DELAYED_RELEASE_TABLET | Freq: Every day | ORAL | Status: DC
  Administered 2011-05-23 – 2011-05-24 (×2): 325 mg via ORAL
  Filled 2011-05-22 (×3): qty 1

## 2011-05-22 MED ORDER — SODIUM CHLORIDE 0.9 % IV SOLN
0.1000 ug/kg/h | INTRAVENOUS | Status: DC
  Administered 2011-05-22: 0.7 ug/kg/h via INTRAVENOUS
  Filled 2011-05-22: qty 2

## 2011-05-22 MED ORDER — METOPROLOL TARTRATE 1 MG/ML IV SOLN
2.5000 mg | INTRAVENOUS | Status: DC | PRN
  Administered 2011-05-22: 5 mg via INTRAVENOUS

## 2011-05-22 MED ORDER — ACETAMINOPHEN 500 MG PO TABS
1000.0000 mg | ORAL_TABLET | Freq: Four times a day (QID) | ORAL | Status: DC
  Administered 2011-05-23 – 2011-05-25 (×8): 1000 mg via ORAL
  Filled 2011-05-22 (×12): qty 2

## 2011-05-22 MED ORDER — CALCIUM CHLORIDE 10 % IV SOLN
INTRAVENOUS | Status: DC | PRN
  Administered 2011-05-22 (×2): .2 g via INTRAVENOUS

## 2011-05-22 MED ORDER — PHENYLEPHRINE HCL 10 MG/ML IJ SOLN
20.0000 mg | INTRAVENOUS | Status: DC | PRN
  Administered 2011-05-22: 50 ug/min via INTRAVENOUS

## 2011-05-22 MED ORDER — LACTATED RINGERS IV SOLN: 500.0000 mL | Freq: Once | INTRAVENOUS | Status: AC | PRN

## 2011-05-22 MED ORDER — VANCOMYCIN HCL 1000 MG IV SOLR
1000.0000 mg | Freq: Once | INTRAVENOUS | Status: AC
  Administered 2011-05-22: 1000 mg via INTRAVENOUS
  Filled 2011-05-22: qty 1000

## 2011-05-22 MED ORDER — HEMOSTATIC AGENTS (NO CHARGE) OPTIME
TOPICAL | Status: DC | PRN
  Administered 2011-05-22: 3 via TOPICAL

## 2011-05-22 MED ORDER — POTASSIUM CHLORIDE 10 MEQ/50ML IV SOLN
10.0000 meq | INTRAVENOUS | Status: AC
  Administered 2011-05-22 (×3): 10 meq via INTRAVENOUS

## 2011-05-22 MED ORDER — SODIUM CHLORIDE 0.9 % IV SOLN
INTRAVENOUS | Status: DC
  Filled 2011-05-22: qty 1

## 2011-05-22 MED ORDER — SODIUM CHLORIDE 0.9 % IV SOLN
INTRAVENOUS | Status: DC
  Administered 2011-05-23: via INTRAVENOUS

## 2011-05-22 MED ORDER — SODIUM CHLORIDE 0.9 % IV SOLN
200.0000 ug | INTRAVENOUS | Status: DC | PRN
  Administered 2011-05-22: 0.2 ug/kg/h via INTRAVENOUS

## 2011-05-22 MED ORDER — MIDAZOLAM HCL 5 MG/5ML IJ SOLN
INTRAMUSCULAR | Status: DC | PRN
  Administered 2011-05-22: 4 mg via INTRAVENOUS
  Administered 2011-05-22 (×4): 2 mg via INTRAVENOUS
  Administered 2011-05-22: 5 mg via INTRAVENOUS
  Administered 2011-05-22: 3 mg via INTRAVENOUS

## 2011-05-22 MED ORDER — ACETAMINOPHEN 650 MG RE SUPP
650.0000 mg | RECTAL | Status: AC
  Administered 2011-05-22: 650 mg via RECTAL

## 2011-05-22 MED ORDER — TAMSULOSIN HCL 0.4 MG PO CAPS
0.4000 mg | ORAL_CAPSULE | Freq: Every day | ORAL | Status: DC
  Administered 2011-05-23 – 2011-05-27 (×5): 0.4 mg via ORAL
  Filled 2011-05-22 (×6): qty 1

## 2011-05-22 MED ORDER — MIDAZOLAM HCL 2 MG/2ML IJ SOLN: 2.0000 mg | INTRAMUSCULAR | Status: DC | PRN

## 2011-05-22 MED ORDER — DEXTROSE 5 % IV SOLN
4.0000 g | Freq: Once | INTRAVENOUS | Status: AC
  Administered 2011-05-22: 4 g via INTRAVENOUS
  Filled 2011-05-22: qty 8

## 2011-05-22 MED ORDER — LACTATED RINGERS IV SOLN
INTRAVENOUS | Status: DC
  Administered 2011-05-23: via INTRAVENOUS

## 2011-05-22 MED ORDER — ALBUMIN HUMAN 5 % IV SOLN
INTRAVENOUS | Status: DC | PRN
  Administered 2011-05-22 (×2): via INTRAVENOUS

## 2011-05-22 MED ORDER — 0.9 % SODIUM CHLORIDE (POUR BTL) OPTIME
TOPICAL | Status: DC | PRN
  Administered 2011-05-22: 6000 mL

## 2011-05-22 MED ORDER — INSULIN ASPART 100 UNIT/ML ~~LOC~~ SOLN
0.0000 [IU] | SUBCUTANEOUS | Status: AC
  Administered 2011-05-22 – 2011-05-23 (×2): 2 [IU] via SUBCUTANEOUS
  Filled 2011-05-22: qty 3

## 2011-05-22 MED ORDER — OXYCODONE HCL 5 MG PO TABS
5.0000 mg | ORAL_TABLET | ORAL | Status: DC | PRN
  Administered 2011-05-23: 5 mg via ORAL
  Filled 2011-05-22: qty 1

## 2011-05-22 MED ORDER — AMINOCAPROIC ACID 250 MG/ML IV SOLN
10.0000 g | INTRAVENOUS | Status: DC | PRN
  Administered 2011-05-22: 70 g/h via INTRAVENOUS

## 2011-05-22 MED ORDER — SODIUM CHLORIDE 0.9 % IJ SOLN
3.0000 mL | Freq: Two times a day (BID) | INTRAMUSCULAR | Status: DC
  Administered 2011-05-23 – 2011-05-24 (×3): 3 mL via INTRAVENOUS

## 2011-05-22 MED ORDER — MOXIFLOXACIN HCL IN NACL 400 MG/250ML IV SOLN
400.0000 mg | INTRAVENOUS | Status: AC
  Administered 2011-05-23: 400 mg via INTRAVENOUS
  Filled 2011-05-22: qty 250

## 2011-05-22 MED ORDER — HEMOSTATIC AGENTS (NO CHARGE) OPTIME
TOPICAL | Status: DC | PRN
  Administered 2011-05-22: 1 via TOPICAL

## 2011-05-22 MED ORDER — PROTAMINE SULFATE 10 MG/ML IV SOLN
INTRAVENOUS | Status: DC | PRN
  Administered 2011-05-22: 50 mg via INTRAVENOUS
  Administered 2011-05-22: 90 mg via INTRAVENOUS
  Administered 2011-05-22: 50 mg via INTRAVENOUS
  Administered 2011-05-22: 10 mg via INTRAVENOUS

## 2011-05-22 MED ORDER — HEPARIN SODIUM (PORCINE) 1000 UNIT/ML IJ SOLN
INTRAMUSCULAR | Status: DC | PRN
  Administered 2011-05-22: 24000 [IU] via INTRAVENOUS

## 2011-05-22 MED ORDER — MORPHINE SULFATE 2 MG/ML IJ SOLN: 1.0000 mg | INTRAMUSCULAR | Status: DC | PRN

## 2011-05-22 MED ORDER — PHENYLEPHRINE HCL 10 MG/ML IJ SOLN
0.0000 ug/min | INTRAVENOUS | Status: DC
  Filled 2011-05-22: qty 2

## 2011-05-22 MED ORDER — SODIUM CHLORIDE 0.9 % IV SOLN
100.0000 [IU] | INTRAVENOUS | Status: DC | PRN
  Administered 2011-05-22: 1 [IU]/h via INTRAVENOUS

## 2011-05-22 MED ORDER — SODIUM CHLORIDE 0.9 % IJ SOLN: 3.0000 mL | INTRAMUSCULAR | Status: DC | PRN

## 2011-05-22 MED ORDER — ACETAMINOPHEN 160 MG/5ML PO SOLN
975.0000 mg | Freq: Four times a day (QID) | ORAL | Status: DC
  Administered 2011-05-22: 975 mg
  Filled 2011-05-22: qty 40.6

## 2011-05-22 MED ORDER — FENTANYL CITRATE 0.05 MG/ML IJ SOLN
INTRAMUSCULAR | Status: DC | PRN
  Administered 2011-05-22: 250 ug via INTRAVENOUS
  Administered 2011-05-22: 150 ug via INTRAVENOUS
  Administered 2011-05-22: 950 ug via INTRAVENOUS
  Administered 2011-05-22: 100 ug via INTRAVENOUS
  Administered 2011-05-22: 50 ug via INTRAVENOUS

## 2011-05-22 MED ORDER — ACETAMINOPHEN 160 MG/5ML PO SOLN: 650.0000 mg | ORAL | Status: AC

## 2011-05-22 MED ORDER — SODIUM CHLORIDE 0.45 % IV SOLN
INTRAVENOUS | Status: DC
  Administered 2011-05-22: 20 mL/h via INTRAVENOUS

## 2011-05-22 MED ORDER — NITROGLYCERIN IN D5W 200-5 MCG/ML-% IV SOLN
0.0000 ug/min | INTRAVENOUS | Status: DC
  Administered 2011-05-22: 100 ug/kg/min via INTRAVENOUS

## 2011-05-22 MED ORDER — ASPIRIN 81 MG PO CHEW: 324.0000 mg | CHEWABLE_TABLET | Freq: Every day | ORAL | Status: DC

## 2011-05-22 MED ORDER — METOPROLOL TARTRATE 25 MG/10 ML ORAL SUSPENSION
12.5000 mg | Freq: Two times a day (BID) | ORAL | Status: DC
  Filled 2011-05-22 (×3): qty 5

## 2011-05-22 MED ORDER — CALCIUM CHLORIDE 10 % IV SOLN
0.5000 g | Freq: Once | INTRAVENOUS | Status: AC
  Administered 2011-05-22: 0.5 g via INTRAVENOUS

## 2011-05-22 MED ORDER — INSULIN ASPART 100 UNIT/ML ~~LOC~~ SOLN: 0.0000 [IU] | SUBCUTANEOUS | Status: DC

## 2011-05-22 MED ORDER — OMEGA-3 FATTY ACIDS 1000 MG PO CAPS
1.0000 g | ORAL_CAPSULE | Freq: Every day | ORAL | Status: DC
  Administered 2011-05-23 – 2011-05-27 (×5): 1 g via ORAL
  Filled 2011-05-22 (×5): qty 1

## 2011-05-22 MED ORDER — BISACODYL 10 MG RE SUPP: 10.0000 mg | Freq: Every day | RECTAL | Status: DC

## 2011-05-22 MED ORDER — PAPAVERINE HCL 30 MG/ML IJ SOLN
INTRAMUSCULAR | Status: DC | PRN
  Administered 2011-05-22: 60 mg via INTRAVENOUS

## 2011-05-22 MED ORDER — METOPROLOL TARTRATE 12.5 MG HALF TABLET
12.5000 mg | ORAL_TABLET | Freq: Two times a day (BID) | ORAL | Status: DC
  Administered 2011-05-23: 12.5 mg via ORAL
  Filled 2011-05-22 (×3): qty 1

## 2011-05-22 MED ORDER — LACTATED RINGERS IV SOLN
INTRAVENOUS | Status: DC | PRN
  Administered 2011-05-22 (×3): via INTRAVENOUS

## 2011-05-22 MED ORDER — ONDANSETRON HCL 4 MG/2ML IJ SOLN
4.0000 mg | Freq: Four times a day (QID) | INTRAMUSCULAR | Status: DC | PRN
  Administered 2011-05-23: 4 mg via INTRAVENOUS
  Filled 2011-05-22: qty 2

## 2011-05-22 MED ORDER — NITROPRUSSIDE SODIUM 25 MG/ML IV SOLN
0.2500 ug/kg/min | INTRAVENOUS | Status: DC
  Administered 2011-05-22: 0.6 ug/kg/min via INTRAVENOUS
  Filled 2011-05-22: qty 2

## 2011-05-22 MED ORDER — FAMOTIDINE IN NACL 20-0.9 MG/50ML-% IV SOLN
20.0000 mg | Freq: Two times a day (BID) | INTRAVENOUS | Status: AC
  Administered 2011-05-22 (×2): 20 mg via INTRAVENOUS
  Filled 2011-05-22: qty 50

## 2011-05-22 MED FILL — Potassium Chloride Inj 2 mEq/ML: INTRAVENOUS | Qty: 40 | Status: AC

## 2011-05-22 MED FILL — Magnesium Sulfate Inj 50%: INTRAMUSCULAR | Qty: 10 | Status: AC

## 2011-05-22 MED FILL — Dexmedetomidine HCl IV Soln 200 MCG/2ML: INTRAVENOUS | Qty: 2 | Status: AC

## 2011-05-22 SURGICAL SUPPLY — 121 items
ATTRACTOMAT 16X20 MAGNETIC DRP (DRAPES) ×2 IMPLANT
BAG DECANTER FOR FLEXI CONT (MISCELLANEOUS) ×2 IMPLANT
BANDAGE ELASTIC 4 VELCRO ST LF (GAUZE/BANDAGES/DRESSINGS) ×2 IMPLANT
BANDAGE ELASTIC 6 VELCRO ST LF (GAUZE/BANDAGES/DRESSINGS) ×2 IMPLANT
BANDAGE GAUZE ELAST BULKY 4 IN (GAUZE/BANDAGES/DRESSINGS) ×2 IMPLANT
BLADE SAW STERNAL (BLADE) ×2 IMPLANT
BLADE SURG 11 STRL SS (BLADE) ×2 IMPLANT
BLADE SURG ROTATE 9660 (MISCELLANEOUS) ×2 IMPLANT
CANISTER SUCTION 2500CC (MISCELLANEOUS) ×2 IMPLANT
CANN PRFSN .5XCNCT 15X34-48 (MISCELLANEOUS) ×1
CANNULA PRFSN .5XCNCT 15X34-48 (MISCELLANEOUS) ×1 IMPLANT
CANNULA VEN 2 STAGE (MISCELLANEOUS) ×1
CANNULA VESSEL W/WING WO/VALVE (CANNULA) IMPLANT
CATH CPB KIT GERHARDT (MISCELLANEOUS) ×2 IMPLANT
CATH ROBINSON RED A/P 18FR (CATHETERS) IMPLANT
CATH THORACIC 28FR (CATHETERS) ×2 IMPLANT
CATH THORACIC 28FR RT ANG (CATHETERS) IMPLANT
CATH THORACIC 36FR (CATHETERS) IMPLANT
CATH THORACIC 36FR RT ANG (CATHETERS) IMPLANT
CLIP FOGARTY SPRING 6M (CLIP) IMPLANT
CLIP TI MEDIUM 24 (CLIP) IMPLANT
CLIP TI WIDE RED SMALL 24 (CLIP) IMPLANT
CLOTH BEACON ORANGE TIMEOUT ST (SAFETY) ×2 IMPLANT
COVER SURGICAL LIGHT HANDLE (MISCELLANEOUS) ×4 IMPLANT
CRADLE DONUT ADULT HEAD (MISCELLANEOUS) ×2 IMPLANT
DERMABOND ADVANCED (GAUZE/BANDAGES/DRESSINGS) ×1
DERMABOND ADVANCED .7 DNX12 (GAUZE/BANDAGES/DRESSINGS) ×1 IMPLANT
DRAIN CHANNEL 28F RND 3/8 FF (WOUND CARE) ×2 IMPLANT
DRAIN CHANNEL 32F RND 10.7 FF (WOUND CARE) IMPLANT
DRAPE CARDIOVASCULAR INCISE (DRAPES) ×2
DRAPE SLUSH MACHINE 52X66 (DRAPES) IMPLANT
DRAPE SLUSH/WARMER DISC (DRAPES) IMPLANT
DRAPE SRG 135X102X78XABS (DRAPES) ×1 IMPLANT
DRSG COVADERM 4X14 (GAUZE/BANDAGES/DRESSINGS) ×2 IMPLANT
ELECT BLADE 4.0 EZ CLEAN MEGAD (MISCELLANEOUS) ×2
ELECT CAUTERY BLADE 6.4 (BLADE) ×2 IMPLANT
ELECT REM PT RETURN 9FT ADLT (ELECTROSURGICAL) ×4
ELECTRODE BLDE 4.0 EZ CLN MEGD (MISCELLANEOUS) ×1 IMPLANT
ELECTRODE REM PT RTRN 9FT ADLT (ELECTROSURGICAL) ×2 IMPLANT
GLOVE BIO SURGEON STRL SZ 6 (GLOVE) IMPLANT
GLOVE BIO SURGEON STRL SZ 6.5 (GLOVE) ×8 IMPLANT
GLOVE BIO SURGEON STRL SZ7 (GLOVE) ×4 IMPLANT
GLOVE BIO SURGEON STRL SZ7.5 (GLOVE) IMPLANT
GLOVE BIOGEL PI IND STRL 6 (GLOVE) ×2 IMPLANT
GLOVE BIOGEL PI IND STRL 6.5 (GLOVE) ×3 IMPLANT
GLOVE BIOGEL PI IND STRL 7.0 (GLOVE) IMPLANT
GLOVE BIOGEL PI INDICATOR 6 (GLOVE) ×2
GLOVE BIOGEL PI INDICATOR 6.5 (GLOVE) ×3
GLOVE BIOGEL PI INDICATOR 7.0 (GLOVE)
GLOVE EUDERMIC 7 POWDERFREE (GLOVE) ×2 IMPLANT
GLOVE ORTHO TXT STRL SZ7.5 (GLOVE) IMPLANT
GOWN STRL NON-REIN LRG LVL3 (GOWN DISPOSABLE) ×8 IMPLANT
HEMOSTAT POWDER SURGIFOAM 1G (HEMOSTASIS) ×6 IMPLANT
HEMOSTAT SURGICEL 2X14 (HEMOSTASIS) ×2 IMPLANT
INSERT FOGARTY 61MM (MISCELLANEOUS) ×2 IMPLANT
INSERT FOGARTY XLG (MISCELLANEOUS) IMPLANT
KIT BASIN OR (CUSTOM PROCEDURE TRAY) ×2 IMPLANT
KIT ROOM TURNOVER OR (KITS) ×2 IMPLANT
KIT SUCTION CATH 14FR (SUCTIONS) ×4 IMPLANT
KIT VASOVIEW W/TROCAR VH 2000 (KITS) ×2 IMPLANT
LEAD PACING MYOCARDI (MISCELLANEOUS) ×2 IMPLANT
MARKER GRAFT CORONARY BYPASS (MISCELLANEOUS) ×6 IMPLANT
NS IRRIG 1000ML POUR BTL (IV SOLUTION) ×10 IMPLANT
PACK OPEN HEART (CUSTOM PROCEDURE TRAY) ×2 IMPLANT
PAD ARMBOARD 7.5X6 YLW CONV (MISCELLANEOUS) ×4 IMPLANT
PENCIL BUTTON HOLSTER BLD 10FT (ELECTRODE) ×2 IMPLANT
PUNCH AORTIC ROTATE 4.0MM (MISCELLANEOUS) IMPLANT
PUNCH AORTIC ROTATE 4.5MM 8IN (MISCELLANEOUS) ×2 IMPLANT
PUNCH AORTIC ROTATE 5MM 8IN (MISCELLANEOUS) IMPLANT
SET CARDIOPLEGIA MPS 5001102 (MISCELLANEOUS) ×2 IMPLANT
SOLUTION ANTI FOG 6CC (MISCELLANEOUS) IMPLANT
SPONGE GAUZE 4X4 12PLY (GAUZE/BANDAGES/DRESSINGS) ×4 IMPLANT
SPONGE GAUZE 4X4 FOR O.R. (GAUZE/BANDAGES/DRESSINGS) ×4 IMPLANT
SPONGE INTESTINAL PEANUT (DISPOSABLE) IMPLANT
SPONGE LAP 18X18 X RAY DECT (DISPOSABLE) ×6 IMPLANT
SPONGE LAP 4X18 X RAY DECT (DISPOSABLE) IMPLANT
SURGIFLO TRUKIT (HEMOSTASIS) ×2 IMPLANT
SUT BONE WAX W31G (SUTURE) IMPLANT
SUT MNCRL AB 3-0 PS2 18 (SUTURE) ×2 IMPLANT
SUT MNCRL AB 4-0 PS2 18 (SUTURE) IMPLANT
SUT PROLENE 3 0 SH 1 (SUTURE) ×8 IMPLANT
SUT PROLENE 3 0 SH DA (SUTURE) ×2 IMPLANT
SUT PROLENE 3 0 SH1 36 (SUTURE) IMPLANT
SUT PROLENE 4 0 RB 1 (SUTURE)
SUT PROLENE 4 0 SH DA (SUTURE) IMPLANT
SUT PROLENE 4 0 TF (SUTURE) IMPLANT
SUT PROLENE 4-0 RB1 .5 CRCL 36 (SUTURE) IMPLANT
SUT PROLENE 5 0 C 1 36 (SUTURE) IMPLANT
SUT PROLENE 6 0 C 1 30 (SUTURE) ×4 IMPLANT
SUT PROLENE 6 0 CC (SUTURE) ×8 IMPLANT
SUT PROLENE 7 0 BV 1 (SUTURE) IMPLANT
SUT PROLENE 7 0 BV1 MDA (SUTURE) ×2 IMPLANT
SUT PROLENE 7.0 RB 3 (SUTURE) IMPLANT
SUT PROLENE 8 0 BV175 6 (SUTURE) ×24 IMPLANT
SUT SILK  1 MH (SUTURE)
SUT SILK 1 MH (SUTURE) IMPLANT
SUT SILK 2 0 SH CR/8 (SUTURE) IMPLANT
SUT SILK 3 0 SH CR/8 (SUTURE) IMPLANT
SUT STEEL 6MS V (SUTURE) IMPLANT
SUT STEEL STERNAL CCS#1 18IN (SUTURE) ×2 IMPLANT
SUT STEEL SZ 6 DBL 3X14 BALL (SUTURE) ×2 IMPLANT
SUT VIC AB 1 CTX 18 (SUTURE) ×4 IMPLANT
SUT VIC AB 1 CTX 36 (SUTURE)
SUT VIC AB 1 CTX36XBRD ANBCTR (SUTURE) IMPLANT
SUT VIC AB 2-0 CT1 27 (SUTURE) ×2
SUT VIC AB 2-0 CT1 TAPERPNT 27 (SUTURE) ×1 IMPLANT
SUT VIC AB 2-0 CTX 27 (SUTURE) IMPLANT
SUT VIC AB 3-0 SH 27 (SUTURE)
SUT VIC AB 3-0 SH 27X BRD (SUTURE) IMPLANT
SUT VIC AB 3-0 X1 27 (SUTURE) IMPLANT
SUT VICRYL 4-0 PS2 18IN ABS (SUTURE) IMPLANT
SUTURE E-PAK OPEN HEART (SUTURE) ×2 IMPLANT
SYSTEM SAHARA CHEST DRAIN ATS (WOUND CARE) ×2 IMPLANT
TOWEL OR 17X24 6PK STRL BLUE (TOWEL DISPOSABLE) ×4 IMPLANT
TOWEL OR 17X26 10 PK STRL BLUE (TOWEL DISPOSABLE) ×6 IMPLANT
TRAY FOLEY IC TEMP SENS 14FR (CATHETERS) ×2 IMPLANT
TUBE FEEDING 8FR 16IN STR KANG (MISCELLANEOUS) ×2 IMPLANT
TUBE SUCT INTRACARD DLP 20F (MISCELLANEOUS) ×2 IMPLANT
TUBING INSUFFLATION 10FT LAP (TUBING) ×2 IMPLANT
UNDERPAD 30X30 INCONTINENT (UNDERPADS AND DIAPERS) ×2 IMPLANT
WATER STERILE IRR 1000ML POUR (IV SOLUTION) ×4 IMPLANT

## 2011-05-22 NOTE — Progress Notes (Signed)
Patient ID: Charles Grant, male   DOB: 01-02-1934, 77 y.o.   MRN: [id number]   Filed Vitals:   05/22/11 1630 05/22/11 1645 05/22/11 1700 05/22/11 1715  BP: 112/66 104/62 99/61 96/62   Pulse: 70 70 70 70  Temp: 96.3 F (35.7 C) 96.4 F (35.8 C) 96.6 F (35.9 C) 96.8 F (36 C)  TempSrc:      Resp: 13 12 11 12   Height:      Weight:      SpO2: 100% 100% 100% 100%   CI=2.8  On no inotropes  Still on vent but weaning. Urine output good CT output low  BMET    Component Value Date/Time   NA 136 05/22/2011 1346   K 3.4* 05/22/2011 1346   CL 101 05/22/2011 0505   CO2 30 05/22/2011 0505   GLUCOSE 76 05/22/2011 1346   BUN 10 05/22/2011 0505   CREATININE 0.87 05/22/2011 0505   CALCIUM 9.3 05/22/2011 0505   GFRNONAA 82* 05/22/2011 0505   GFRAA >90 05/22/2011 0505    CBC    Component Value Date/Time   WBC 5.6 05/22/2011 1345   RBC 3.18* 05/22/2011 1345   HGB 9.2* 05/22/2011 1346   HCT 27.0* 05/22/2011 1346   PLT 76* 05/22/2011 1345   MCV 89.3 05/22/2011 1345   MCH 31.8 05/22/2011 1345   MCHC 35.6 05/22/2011 1345   RDW 12.6 05/22/2011 1345    A/P:  Stable postop.  Wean to extubate. K+ repleated.

## 2011-05-22 NOTE — Op Note (Signed)
Correction to the prior report.   The Impressions section should read.: The LV ejection fraction was 60%.  The LVEDP was 13 mm Hg.  VARANASI,JAYADEEP S. 05/22/2011

## 2011-05-22 NOTE — OR Nursing (Signed)
2nd call made to SICU @ 13:00

## 2011-05-22 NOTE — Transfer of Care (Signed)
Immediate Anesthesia Transfer of Care Note  Patient: Charles Grant  Procedure(s) Performed:  CORONARY ARTERY BYPASS GRAFTING (CABG) - CABG x five;  using left internal mammary artery and right leg greater saphenous vein harvested endoscopically  Patient Location: ICU  Anesthesia Type: General  Level of Consciousness: sedated  Airway & Oxygen Therapy: Patient remains intubated per anesthesia plan  Post-op Assessment: Report given to PACU RN and Post -op Vital signs reviewed and stable  Post vital signs: Reviewed and stable  Complications: No apparent anesthesia complications

## 2011-05-22 NOTE — Anesthesia Postprocedure Evaluation (Signed)
  Anesthesia Post-op Note  Patient: Charles Grant  Procedure(s) Performed:  CORONARY ARTERY BYPASS GRAFTING (CABG) - CABG x five;  using left internal mammary artery and right leg greater saphenous vein harvested endoscopically  Patient Location: SICU  Anesthesia Type: General  Level of Consciousness: sedated  Airway and Oxygen Therapy: Patient remains intubated per anesthesia plan  Post-op Pain: none  Post-op Assessment: Post-op Vital signs reviewed, Patient's Cardiovascular Status Stable, Respiratory Function Stable, No signs of Nausea or vomiting and Pain level controlled  Post-op Vital Signs: Reviewed and stable  Complications: No apparent anesthesia complications

## 2011-05-22 NOTE — Progress Notes (Signed)
Patient placed back on full support following latest wean attempt.  Progressed through entire progress, wean mechanics acquired, NIF -30, VC 1200, able to hold head off bed 15 seconds.  ABG results were acidotic, PCO2 52.  Decision made with RN to place back on support, attempt wean again later.

## 2011-05-22 NOTE — Procedures (Signed)
Extubation Procedure Note  Patient Details:   Name: Charles Grant DOB: 1934/04/20 MRN: [id number]   Airway Documentation:  Patient extubated to 4 lpm nasal cannula at 2304.  VC 1800 ml, NIF -40, able to hold head off bed 15 seconds.  No complications with procedure, patient able to breathe around deflated cuff, able to vocalize post procedure.  IS done with patient, 2500 ml X 8 efforts.  Evaluation  O2 sats: stable throughout Complications: No apparent complications Patient did tolerate procedure well. Bilateral Breath Sounds: Clear   Yes  Charles Grant, Aloha Gell 05/22/2011, 11:17 PM

## 2011-05-22 NOTE — Progress Notes (Signed)
Patient's blood pressures have been labile since arrival from OR.  Surgeon at bedside with RN and aware.

## 2011-05-22 NOTE — Brief Op Note (Signed)
05/21/2011 - 05/22/2011  11:20 AM  PATIENT:  Charles Grant  77 y.o. male  PRE-OPERATIVE DIAGNOSIS:  CAD with critical left main  POST-OPERATIVE DIAGNOSIS:  CAD with critical left main  PROCEDURE:  Procedure(s): CORONARY ARTERY BYPASS GRAFTING (CABG)x5 (LIMA to LAD,SVG sequentially to Diagonal 1 and Diagonal 2, SVG sequentially to distal Circumflex and OM1) with EVH from right thigh  SURGEON:  Delight Ovens, MD  PHYSICIAN ASSISTANT: Doree Fudge PA-C  ANESTHESIA:   general  EBL:  Total I/O In: 1000 [I.V.:1000] Out: 1250 [Urine:1250]  BLOOD ADMINISTERED:1 unit  PRBC  COUNTS:  YES   DICTATION: .Other Dictation: Dictation Number   PLAN OF CARE: Admit to inpatient   PATIENT DISPOSITION:  ICU - intubated and hemodynamically stable.  PRE OP WEIGHT: 71 kg   Delay start of Pharmacological VTE agent (>24hrs) due to surgical blood loss or risk of bleeding: YES

## 2011-05-22 NOTE — Preoperative (Signed)
Beta Blockers   Reason not to administer Beta Blockers:Pt does not take B Blockers 

## 2011-05-22 NOTE — Anesthesia Procedure Notes (Addendum)
Procedure Name: Intubation Date/Time: 05/22/2011 7:27 AM Performed by: Rossie Muskrat Pre-anesthesia Checklist: Patient identified, Timeout performed, Emergency Drugs available, Suction available and Patient being monitored Patient Re-evaluated:Patient Re-evaluated prior to inductionOxygen Delivery Method: Circle System Utilized Preoxygenation: Pre-oxygenation with 100% oxygen Intubation Type: IV induction Ventilation: Mask ventilation without difficulty Laryngoscope Size: Miller and 2 Grade View: Grade I Tube type: Oral Tube size: 8.0 mm Number of attempts: 1 Airway Equipment and Method: stylet Placement Confirmation: ETT inserted through vocal cords under direct vision,  breath sounds checked- equal and bilateral and positive ETCO2 Secured at: 20 cm Tube secured with: Tape Dental Injury: Teeth and Oropharynx as per pre-operative assessment

## 2011-05-22 NOTE — OR Nursing (Signed)
12:30pm call made to volunteer desk--inform family off pump, 1st call made to SICU

## 2011-05-23 ENCOUNTER — Other Ambulatory Visit: Payer: Self-pay

## 2011-05-23 ENCOUNTER — Inpatient Hospital Stay (HOSPITAL_COMMUNITY): Payer: Medicare Other

## 2011-05-23 LAB — CREATININE, SERUM
Creatinine, Ser: 1.24 mg/dL (ref 0.50–1.35)
GFR calc Af Amer: 64 mL/min — ABNORMAL LOW (ref >90)

## 2011-05-23 LAB — POCT I-STAT, CHEM 8
BUN: 17 mg/dL (ref 6–23)
Calcium, Ion: 1.27 mmol/L (ref 1.12–1.32)
Chloride: 101 mEq/L (ref 96–112)
Creatinine, Ser: 1.2 mg/dL (ref 0.50–1.35)
Glucose, Bld: 131 mg/dL — ABNORMAL HIGH (ref 70–99)
HCT: 24 % — ABNORMAL LOW (ref 39.0–52.0)
Hemoglobin: 8.2 g/dL — ABNORMAL LOW (ref 13.0–17.0)
Potassium: 4.1 mEq/L (ref 3.5–5.1)
Sodium: 138 mEq/L (ref 135–145)
TCO2: 24 mmol/L (ref 0–100)

## 2011-05-23 LAB — CBC
HCT: 23.4 % — ABNORMAL LOW (ref 39.0–52.0)
Hemoglobin: 8.2 g/dL — ABNORMAL LOW (ref 13.0–17.0)
MCH: 31.7 pg (ref 26.0–34.0)
MCV: 90.3 fL (ref 78.0–100.0)
Platelets: 77 10*3/uL — ABNORMAL LOW (ref 150–400)
RBC: 2.59 MIL/uL — ABNORMAL LOW (ref 4.22–5.81)
RBC: 2.67 MIL/uL — ABNORMAL LOW (ref 4.22–5.81)
WBC: 8.6 10*3/uL (ref 4.0–10.5)

## 2011-05-23 LAB — GLUCOSE, CAPILLARY
Glucose-Capillary: 101 mg/dL — ABNORMAL HIGH (ref 70–99)
Glucose-Capillary: 109 mg/dL — ABNORMAL HIGH (ref 70–99)
Glucose-Capillary: 113 mg/dL — ABNORMAL HIGH (ref 70–99)
Glucose-Capillary: 121 mg/dL — ABNORMAL HIGH (ref 70–99)
Glucose-Capillary: 122 mg/dL — ABNORMAL HIGH (ref 70–99)
Glucose-Capillary: 125 mg/dL — ABNORMAL HIGH (ref 70–99)
Glucose-Capillary: 97 mg/dL (ref 70–99)
Glucose-Capillary: 98 mg/dL (ref 70–99)
Glucose-Capillary: 99 mg/dL (ref 70–99)
Glucose-Capillary: 99 mg/dL (ref 70–99)

## 2011-05-23 LAB — BASIC METABOLIC PANEL
BUN: 11 mg/dL (ref 6–23)
CO2: 25 mEq/L (ref 19–32)
Calcium: 8.8 mg/dL (ref 8.4–10.5)
Creatinine, Ser: 0.96 mg/dL (ref 0.50–1.35)
Glucose, Bld: 103 mg/dL — ABNORMAL HIGH (ref 70–99)
Sodium: 139 mEq/L (ref 135–145)

## 2011-05-23 LAB — MAGNESIUM: Magnesium: 2.1 mg/dL (ref 1.5–2.5)

## 2011-05-23 MED ORDER — INSULIN ASPART 100 UNIT/ML ~~LOC~~ SOLN
0.0000 [IU] | SUBCUTANEOUS | Status: DC
  Administered 2011-05-23: 2 [IU] via SUBCUTANEOUS

## 2011-05-23 MED ORDER — FUROSEMIDE 10 MG/ML IJ SOLN
40.0000 mg | Freq: Once | INTRAMUSCULAR | Status: AC
  Administered 2011-05-23: 40 mg via INTRAVENOUS
  Filled 2011-05-23: qty 4

## 2011-05-23 MED ORDER — FERROUS GLUCONATE 324 (38 FE) MG PO TABS
324.0000 mg | ORAL_TABLET | Freq: Two times a day (BID) | ORAL | Status: DC
  Administered 2011-05-23 – 2011-05-27 (×9): 324 mg via ORAL
  Filled 2011-05-23 (×11): qty 1

## 2011-05-23 MED ORDER — TRAMADOL HCL 50 MG PO TABS
50.0000 mg | ORAL_TABLET | Freq: Four times a day (QID) | ORAL | Status: DC | PRN
  Administered 2011-05-23 – 2011-05-26 (×3): 50 mg via ORAL
  Filled 2011-05-23 (×3): qty 1

## 2011-05-23 MED ORDER — POTASSIUM CHLORIDE CRYS ER 20 MEQ PO TBCR
20.0000 meq | EXTENDED_RELEASE_TABLET | Freq: Two times a day (BID) | ORAL | Status: AC
  Administered 2011-05-23 (×2): 20 meq via ORAL
  Filled 2011-05-23 (×2): qty 1

## 2011-05-23 NOTE — Progress Notes (Signed)
Patient ID: Charles Grant, male   DOB: [birth year], 78 y.o.   MRN: [id number]  Filed Vitals:   05/23/11 1600 05/23/11 1700 05/23/11 1730 05/23/11 1800  BP: 85/48 83/50  84/50  Pulse:  90 90 90  Temp:      TempSrc:      Resp:  16 22 17   Height:      Weight:      SpO2:  80% 97% 94%   BP is marginal today but asymptomatic.  CBC    Component Value Date/Time   WBC 8.6 05/23/2011 1555   RBC 2.67* 05/23/2011 1555   HGB 8.2* 05/23/2011 1600   HCT 24.0* 05/23/2011 1600   PLT 77* 05/23/2011 1555   MCV 91.8 05/23/2011 1555   MCH 31.5 05/23/2011 1555   MCHC 34.3 05/23/2011 1555   RDW 13.4 05/23/2011 1555    BMET    Component Value Date/Time   NA 138 05/23/2011 1600   K 4.1 05/23/2011 1600   CL 101 05/23/2011 1600   CO2 25 05/23/2011 0415   GLUCOSE 131* 05/23/2011 1600   BUN 17 05/23/2011 1600   CREATININE 1.20 05/23/2011 1600   CALCIUM 8.8 05/23/2011 0415   GFRNONAA 55* 05/23/2011 1555   GFRAA 64* 05/23/2011 1555    A/P:  He may require transfusion if bp remains low.

## 2011-05-23 NOTE — Progress Notes (Signed)
1 Day Post-Op Procedure(s) (LRB): CORONARY ARTERY BYPASS GRAFTING (CABG) (N/A) Subjective: No complaints  Objective: Vital signs in last 24 hours: Temp:  [92.3 F (33.5 C)-101.7 F (38.7 C)] 98.8 F (37.1 C) (12/15 0900) Pulse Rate:  [56-91] 90  (12/15 0900) Cardiac Rhythm:  [-] Atrial paced (12/15 0800) Resp:  [0-22] 17  (12/15 0900) BP: (64-162)/(27-101) 88/51 mmHg (12/15 0900) SpO2:  [95 %-100 %] 98 % (12/15 0900) FiO2 (%):  [39.6 %-50.5 %] 40.2 % (12/14 2304) Weight:  [73.6 kg (162 lb 4.1 oz)] 162 lb 4.1 oz (73.6 kg) (12/15 0600)  Hemodynamic parameters for last 24 hours: PAP: (14-35)/(3-18) 29/14 mmHg CO:  [3.3 L/min-7.3 L/min] 6.2 L/min CI:  [1.8 L/min/m2-4 L/min/m2] 3.4 L/min/m2  Intake/Output from previous day: 12/14 0701 - 12/15 0700 In: 11087.4 [I.V.:8552.4; Blood:485; IV Piggyback:2050] Out: 7700 [Urine:5185; Blood:1775; Chest Tube:740] Intake/Output this shift: Total I/O In: 320 [P.O.:240; I.V.:80] Out: 40 [Urine:40]  General appearance: alert and cooperative Neurologic: intact Heart: regular rate and rhythm Lungs: clear to auscultation bilaterally Extremities: edema mild Wound: dressing dry  Lab Results:  Basename 05/23/11 0415 05/22/11 2044 05/22/11 2000  WBC 6.1 -- 4.4  HGB 8.2* 8.8* --  HCT 23.4* 26.0* --  PLT 72* -- 75*   BMET:  Basename 05/23/11 0415 05/22/11 2044 05/22/11 0505  NA 139 138 --  K 4.1 4.2 --  CL 106 103 --  CO2 25 -- 30  GLUCOSE 103* 107* --  BUN 11 8 --  CREATININE 0.96 0.90 --  CALCIUM 8.8 -- 9.3    PT/INR:  Basename 05/22/11 1345  LABPROT 18.0*  INR 1.46   ABG    Component Value Date/Time   PHART 7.343* 05/22/2011 2303   HCO3 23.2 05/22/2011 2303   TCO2 24 05/22/2011 2303   ACIDBASEDEF 2.0 05/22/2011 2303   O2SAT 98.0 05/22/2011 2303   CBG (last 3)   Basename 05/23/11 0717 05/23/11 0412 05/22/11 2343  GLUCAP 109* 99 122*   CXR:  Ok  Assessment/Plan: S/P Procedure(s) (LRB): CORONARY ARTERY BYPASS  GRAFTING (CABG) (N/A) Mobilize Diuresis d/c tubes/lines See progression orders Acute blood loss anemia:  Will observe for now.  Start Fe2+ Thrombocytopenia: observe   LOS: 2 days    Charles Grant K 05/23/2011

## 2011-05-23 NOTE — Progress Notes (Signed)
SBP 80's, Dr. Laneta Simmers aware

## 2011-05-24 ENCOUNTER — Inpatient Hospital Stay (HOSPITAL_COMMUNITY): Payer: Medicare Other

## 2011-05-24 LAB — CBC
MCHC: 35 g/dL (ref 30.0–36.0)
RDW: 13.3 % (ref 11.5–15.5)
WBC: 7.9 10*3/uL (ref 4.0–10.5)

## 2011-05-24 LAB — GLUCOSE, CAPILLARY
Glucose-Capillary: 91 mg/dL (ref 70–99)
Glucose-Capillary: 87 mg/dL (ref 70–99)
Glucose-Capillary: 116 mg/dL — ABNORMAL HIGH (ref 70–99)
Glucose-Capillary: 113 mg/dL — ABNORMAL HIGH (ref 70–99)
Glucose-Capillary: 123 mg/dL — ABNORMAL HIGH (ref 70–99)

## 2011-05-24 LAB — BASIC METABOLIC PANEL
BUN: 19 mg/dL (ref 6–23)
Creatinine, Ser: 1.15 mg/dL (ref 0.50–1.35)
GFR calc Af Amer: 70 mL/min — ABNORMAL LOW (ref >90)
GFR calc non Af Amer: 60 mL/min — ABNORMAL LOW (ref >90)
Potassium: 3.9 mEq/L (ref 3.5–5.1)

## 2011-05-24 MED ORDER — BISACODYL 10 MG RE SUPP: 10.0000 mg | Freq: Every day | RECTAL | Status: DC | PRN

## 2011-05-24 MED ORDER — SODIUM CHLORIDE 0.9 % IJ SOLN: 3.0000 mL | INTRAMUSCULAR | Status: DC | PRN

## 2011-05-24 MED ORDER — FUROSEMIDE 40 MG PO TABS
40.0000 mg | ORAL_TABLET | Freq: Every day | ORAL | Status: DC
  Administered 2011-05-24 – 2011-05-27 (×4): 40 mg via ORAL
  Filled 2011-05-24 (×4): qty 1

## 2011-05-24 MED ORDER — POVIDONE-IODINE 10 % EX SOLN: 1.0000 "application " | Freq: Two times a day (BID) | CUTANEOUS | Status: DC

## 2011-05-24 MED ORDER — ONDANSETRON HCL 4 MG PO TABS: 4.0000 mg | ORAL_TABLET | Freq: Four times a day (QID) | ORAL | Status: DC | PRN

## 2011-05-24 MED ORDER — DOCUSATE SODIUM 100 MG PO CAPS
200.0000 mg | ORAL_CAPSULE | Freq: Every day | ORAL | Status: DC
  Administered 2011-05-25: 200 mg via ORAL
  Filled 2011-05-24 (×3): qty 2

## 2011-05-24 MED ORDER — PANTOPRAZOLE SODIUM 40 MG PO TBEC
40.0000 mg | DELAYED_RELEASE_TABLET | Freq: Every day | ORAL | Status: DC
  Administered 2011-05-25 – 2011-05-27 (×3): 40 mg via ORAL
  Filled 2011-05-24 (×3): qty 1

## 2011-05-24 MED ORDER — MOVING RIGHT ALONG BOOK
Freq: Once | Status: AC
  Administered 2011-05-24: 13:00:00
  Filled 2011-05-24: qty 1

## 2011-05-24 MED ORDER — BISACODYL 5 MG PO TBEC: 10.0000 mg | DELAYED_RELEASE_TABLET | Freq: Every day | ORAL | Status: DC | PRN

## 2011-05-24 MED ORDER — SODIUM CHLORIDE 0.9 % IJ SOLN
3.0000 mL | Freq: Two times a day (BID) | INTRAMUSCULAR | Status: DC
  Administered 2011-05-24 – 2011-05-26 (×4): 3 mL via INTRAVENOUS

## 2011-05-24 MED ORDER — SODIUM CHLORIDE 0.9 % IV SOLN: 250.0000 mL | INTRAVENOUS | Status: DC | PRN

## 2011-05-24 MED ORDER — ONDANSETRON HCL 4 MG/2ML IJ SOLN: 4.0000 mg | Freq: Four times a day (QID) | INTRAMUSCULAR | Status: DC | PRN

## 2011-05-24 MED ORDER — POTASSIUM CHLORIDE CRYS ER 20 MEQ PO TBCR
40.0000 meq | EXTENDED_RELEASE_TABLET | Freq: Every day | ORAL | Status: DC
  Administered 2011-05-24 – 2011-05-27 (×4): 40 meq via ORAL
  Filled 2011-05-24 (×4): qty 2

## 2011-05-24 NOTE — Progress Notes (Signed)
                   [address]            [place] [address]          [phone]      2 Days Post-Op Procedure(s) (LRB): CORONARY ARTERY BYPASS GRAFTING (CABG) (N/A) Subjective: No complaints  Objective: Vital signs in last 24 hours: Temp:  [97.8 F (36.6 C)-98.9 F (37.2 C)] 98.9 F (37.2 C) (12/16 0721) Pulse Rate:  [86-90] 90  (12/16 0700) Cardiac Rhythm:  [-] Atrial paced (12/15 2000) Resp:  [13-22] 20  (12/16 0700) BP: (82-116)/(43-61) 110/59 mmHg (12/16 0700) SpO2:  [80 %-100 %] 97 % (12/16 0700) Weight:  [75 kg (165 lb 5.5 oz)] 165 lb 5.5 oz (75 kg) (12/16 0500)  Hemodynamic parameters for last 24 hours: PAP: (29)/(13-14) 29/13 mmHg  Intake/Output from previous day: 12/15 0701 - 12/16 0700 In: 1680 [P.O.:1140; I.V.:540] Out: 1255 [Urine:1205; Chest Tube:50] Intake/Output this shift:    General appearance: alert and cooperative Neurologic: intact Heart: regular rate and rhythm, S1, S2 normal, no murmur, click, rub or gallop Lungs: diminished breath sounds bibasilar Extremities: edema mild Wound: incisions ok  Lab Results:  Basename 05/24/11 0353 05/23/11 1600 05/23/11 1555  WBC 7.9 -- 8.6  HGB 8.4* 8.2* --  HCT 24.0* 24.0* --  PLT 70* -- 77*   BMET:  Basename 05/24/11 0353 05/23/11 1600 05/23/11 0415  NA 133* 138 --  K 3.9 4.1 --  CL 101 101 --  CO2 26 -- 25  GLUCOSE 104* 131* --  BUN 19 17 --  CREATININE 1.15 1.20 --  CALCIUM 8.5 -- 8.8    PT/INR:  Basename 05/22/11 1345  LABPROT 18.0*  INR 1.46   ABG    Component Value Date/Time   PHART 7.343* 05/22/2011 2303   HCO3 23.2 05/22/2011 2303   TCO2 24 05/23/2011 1600   ACIDBASEDEF 2.0 05/22/2011 2303   O2SAT 98.0 05/22/2011 2303   CBG (last 3)   Basename 05/24/11 0719 05/24/11 0312 05/23/11 2300  GLUCAP 87 91 98    *RADIOLOGY REPORT*  Clinical Data: Postop cardiac surgery.  PORTABLE CHEST - 1 VIEW  Comparison: 05/23/2011  Findings: Swan-Ganz catheter has been  removed. Right IJ sheath  remains in place. Left chest tube has been removed. Patient has  had median sternotomy and CABG.  There is no evidence for pneumothorax following chest tube removal.  The heart is enlarged. There is increased bibasilar atelectasis,  left greater than right. No evidence for pulmonary edema.  IMPRESSION:  1. Interval removal of Swan-Ganz catheter, left chest tube.  2. Increased bibasilar atelectasis.  Original Report Authenticated By: Patterson Hammersmith, M.D.       Assessment/Plan: S/P Procedure(s) (LRB): CORONARY ARTERY BYPASS GRAFTING (CABG) (N/A) Mobilize Diuresis Plan for transfer to step-down: see transfer orders   LOS: 3 days    BARTLE,BRYAN K 05/24/2011

## 2011-05-25 LAB — TYPE AND SCREEN
ABO/RH(D): A POS
Antibody Screen: NEGATIVE
Unit division: 0
Unit division: 0
Unit division: 0
Unit division: 0

## 2011-05-25 LAB — CBC
Hemoglobin: 9.9 g/dL — ABNORMAL LOW (ref 13.0–17.0)
MCHC: 34.9 g/dL (ref 30.0–36.0)
RBC: 3.08 MIL/uL — ABNORMAL LOW (ref 4.22–5.81)

## 2011-05-25 LAB — GLUCOSE, CAPILLARY
Glucose-Capillary: 87 mg/dL (ref 70–99)
Glucose-Capillary: 109 mg/dL — ABNORMAL HIGH (ref 70–99)
Glucose-Capillary: 86 mg/dL (ref 70–99)
Glucose-Capillary: 108 mg/dL — ABNORMAL HIGH (ref 70–99)
Glucose-Capillary: 88 mg/dL (ref 70–99)
Glucose-Capillary: 99 mg/dL (ref 70–99)

## 2011-05-25 LAB — BASIC METABOLIC PANEL
GFR calc non Af Amer: 79 mL/min — ABNORMAL LOW (ref >90)
Glucose, Bld: 113 mg/dL — ABNORMAL HIGH (ref 70–99)
Potassium: 4.6 mEq/L (ref 3.5–5.1)
Sodium: 133 mEq/L — ABNORMAL LOW (ref 135–145)

## 2011-05-25 MED ORDER — FUROSEMIDE 40 MG PO TABS: 40.0000 mg | ORAL_TABLET | Freq: Every day | ORAL | Status: DC

## 2011-05-25 MED ORDER — TRAMADOL HCL 50 MG PO TABS: 50.0000 mg | ORAL_TABLET | Freq: Four times a day (QID) | ORAL | Status: DC | PRN

## 2011-05-25 MED ORDER — ROSUVASTATIN CALCIUM 10 MG PO TABS: 10.0000 mg | ORAL_TABLET | Freq: Every day | ORAL | Status: DC

## 2011-05-25 MED ORDER — FERROUS GLUCONATE 324 (38 FE) MG PO TABS: 324.0000 mg | ORAL_TABLET | Freq: Two times a day (BID) | ORAL | Status: DC

## 2011-05-25 MED ORDER — POTASSIUM CHLORIDE CRYS ER 20 MEQ PO TBCR: 20.0000 meq | EXTENDED_RELEASE_TABLET | Freq: Every day | ORAL | Status: DC

## 2011-05-25 MED FILL — Sodium Chloride Irrigation Soln 0.9%: Qty: 3000 | Status: AC

## 2011-05-25 MED FILL — Heparin Sodium (Porcine) Inj 1000 Unit/ML: INTRAMUSCULAR | Qty: 60 | Status: AC

## 2011-05-25 MED FILL — Electrolyte-R (PH 7.4) Solution: INTRAVENOUS | Qty: 4000 | Status: AC

## 2011-05-25 MED FILL — Sodium Chloride IV Soln 0.9%: INTRAVENOUS | Qty: 1000 | Status: AC

## 2011-05-25 NOTE — Op Note (Signed)
NAME:  Charles Grant, Charles Grant               ACCOUNT NO.:  [id number]  MEDICAL RECORD NO.:  [id number]  LOCATION:  2028                         FACILITY:  [facility]  PHYSICIAN:  Sheliah Plane, MD    DATE OF BIRTH:  01-05-1934  DATE OF PROCEDURE:  05/22/2011 DATE OF DISCHARGE:                              OPERATIVE REPORT   PREOPERATIVE DIAGNOSIS:  Coronary occlusive disease with critical left main.  POSTOPERATIVE DIAGNOSIS:  Coronary occlusive disease with critical left main.  SURGICAL PROCEDURE:  Coronary artery bypass grafting x5 with the left internal mammary to the left anterior descending coronary artery, sequential reverse saphenous vein graft to the first and second diagonal, sequential reverse saphenous vein graft to the first and second obtuse marginal, with right leg endo-vein harvesting.  SURGEON:  Sheliah Plane, MD  FIRST ASSISTANT:  Doree Fudge, PA  BRIEF HISTORY:  The patient is a 77 year old male who has had progression of exertional related angina for approximately 8 months, episodic with neck and upper chest discomfort.  He was recently referred to Dr. Eldridge Dace who performed cardiac catheterization which demonstrated significantly calcified left main and proximal LAD with complex greater than 80% stenosis,  also 80% stenosis of the first and second diagonal and proximal circumflex stenosis.  He had a dominant right coronary system with luminal irregularities but no significant stenosis.  Overall ventricular function was preserved.  Because of the patient's significant left main disease and proximal LAD disease and symptoms, coronary artery bypass grafting was recommended to the patient.  Risks and options were discussed in detail and the patient was willing to proceed.  He signed informed consent.  DESCRIPTION OF PROCEDURE:  With Swan-Ganz and arterial line monitors in place, the patient underwent general endotracheal anesthesia without incident.  The skin and  chest and the legs were prepped with Betadine and draped in usual sterile manner.  Using the [employer] endo-vein harvesting system, vein was harvested from the right thigh and was of good quality and caliber.  Median sternotomy was performed.  Left internal mammary artery was dissected down as pedicle graft.  The distal artery was divided and had good free flow.  The pericardium was opened. Overall ventricular function appeared preserved.  The patient was systemically heparinized.  The ascending aorta was cannulated.  The right atrium was cannulated and aortic root vent cardioplegia needle was introduced into the ascending aorta.  The patient was placed on cardiopulmonary bypass at 2.4 L/min/m2.  Sites of anastomosis were selected and dissected out of the epicardium.  The patient's body temperature was cooled to 32 degrees.  Aortic crossclamp was applied and 500 mL of cold blood potassium cardioplegia was administered with diastolic arrest of the heart.  Myocardial septal temperatures were monitored throughout the crossclamp.  Attention was turned first to the OM 1 and OM 2.  These were both relatively small vessels.  OM 1 was opened.  It was approximately 1.4-1.5 mm in size.  Using a diamond type side-to-side anastomosis with a running 8-0 Prolene, a segment of reverse saphenous vein graft was anastomosed.  The distal extent of the same vein was then carried short distance to the second obtuse marginal which was of similar size  1.3-1.4 mm in size.  Using a running 8-0 Prolene, a distal anastomosis was performed.  Attention was then turned to the first and second diagonal.  The highest diagonal was opened and was 1.3-1.4 mm in size.  Using a longitudinal side-to-side anastomosis second reverse saphenous vein graft was carried out.  Distal extent of the same vein was then sewed to the second diagonal, which was of similar size 1.2-1.3 mm in size.  The left anterior descending coronary artery  was of reasonable size with significant disease in the midportion of it, also just distal to this disease, the vessel was opened, and was of good quality, admitted a 1.5 mm probe easily.  Using a running 8-0 Prolene, left internal mammary artery was anastomosed to the left anterior descending coronary artery.  With crossclamp still in place, 2 punch aortotomies were performed.  Each 2 vein grafts were anastomosed to the ascending aorta.  Air was evacuated from the ascending aorta and grafts.  Bulldog on the mammary artery was removed with prompt rise in myocardial septal temperature.  Aortic crossclamp was removed with a total crossclamp time of 108 minutes.  The patient required electric defibrillation turned to sinus rhythm.  He was in a slow sinus rhythm initially and required pacing atrial and ventricular pacing wires were applied.  Graft markers applied.  The patient's body temperature rewarmed to 37 degrees.  He was then ventilated and weaned from cardiopulmonary bypass without difficulty.  He remained hemodynamically stable.  He was decannulated in the usual fashion.  Protamine sulfate was administered with operative field hemostatic.  The pericardium was reapproximated.  Sternum was closed with #6 stainless steel wire. Fascia closed with interrupted 0 Vicryl, running 3-0 Vicryl in subcutaneous tissue, 4-0 subcuticular stitch in skin edges.  Dry dressings were applied.  Sponge and needle count was reported as correct at the completion of the procedure.  The patient tolerated the procedure without obvious complication.  Because of preoperative anemia with hematocrit approximately 33 the day before surgery while on bypass, his hematocrit dropped to 19 and was transfused a unit of packed red blood cells.     Sheliah Plane, MD     EG/MEDQ  D:  05/25/2011  T:  05/25/2011  Job:  [id number]  cc:   Corky Crafts, MD Dr. Earl Gala

## 2011-05-25 NOTE — Progress Notes (Addendum)
3 Days Post-Op Procedure(s) (LRB): CORONARY ARTERY BYPASS GRAFTING (CABG) (N/A)  Subjective: Just back from walking with CRPI.  Feels well.  Has been on 2-3L O2, but currently comfortable around 90% on room air.  Denies SOB.  Objective: Vital signs in last 24 hours: Patient Vitals for the past 24 hrs:  BP Temp Temp src Pulse Resp SpO2 Weight  05/25/11 0420 114/67 mmHg 97.9 F (36.6 C) Oral 80  20  96 % 74.027 kg (163 lb 3.2 oz)  05/24/11 2011 113/69 mmHg 98.2 F (36.8 C) Oral 77  19  92 % -  05/24/11 1700 100/60 mmHg - - 70  20  90 % -  05/24/11 1213 - - - - - 91 % -  05/24/11 1158 97/61 mmHg 98.1 F (36.7 C) - 75  20  88 % -  05/24/11 1000 97/50 mmHg - - 71  20  96 % -   Current Weight  05/25/11 74.027 kg (163 lb 3.2 oz)   Pre-op wt= 71 kg  Intake/Output from previous day: 12/16 0701 - 12/17 0700 In: 1010 [P.O.:970; I.V.:40] Out: 2380 [Urine:2380]    PHYSICAL EXAM:  Heart: RRR Lungs: clear Wound: clean and dry Extremities: mild LE edema  Lab Results: CBC: Basename 05/25/11 0500 05/24/11 0353  WBC 7.8 7.9  HGB 9.9* 8.4*  HCT 28.4* 24.0*  PLT 72* 70*   BMET:  Basename 05/25/11 0500 05/24/11 0353  NA 133* 133*  K 4.6 3.9  CL 103 101  CO2 21 26  GLUCOSE 113* 104*  BUN 18 19  CREATININE 0.96 1.15  CALCIUM 8.9 8.5    PT/INR:  Basename 05/22/11 1345  LABPROT 18.0*  INR 1.46     Assessment/Plan: S/P Procedure(s) (LRB): CORONARY ARTERY BYPASS GRAFTING (CABG) (N/A) CV- stable, SR Vol overload- continue diuresis Pulm- IS, wean O2 ABL anemia- improving Poss home 1-2 days if remains stable.   LOS: 4 days    COLLINS,GINA H 05/25/2011 Still little weak but ambulating well with help Poss D/c Wed  I have seen and examined [name] and agree with the above assessment  and plan.  Delight Ovens MD [facility] [phone] Office [phone] 05/25/2011 11:34 AM

## 2011-05-25 NOTE — Progress Notes (Signed)
CARDIAC REHAB PHASE I   PRE:  Rate/Rhythm: 76 SR    BP: sitting 124/70    SaO2: 89-91 RA RA  MODE:  Ambulation: 350 ft   POST:  Rate/Rhythm: 77    BP: sitting 124/70     SaO2: 96 RA  Pt slightly unsteady, felt better with RW. Tolerated well, light assist x1. D/C'd O2, SaO2 89-91 at rest, 95 RA after walk. To recliner. Requests referral be sent to [facility]. [phone]  Charles Grant CES, ACSM

## 2011-05-25 NOTE — Progress Notes (Signed)
SUBJECTIVE:  Walked today.  Feels better.  Not much pain.  OBJECTIVE:   Vitals:   Filed Vitals:   05/24/11 1213 05/24/11 1700 05/24/11 2011 05/25/11 0420  BP:  100/60 113/69 114/67  Pulse:  70 77 80  Temp:   98.2 F (36.8 C) 97.9 F (36.6 C)  TempSrc:   Oral Oral  Resp:  20 19 20   Height:      Weight:    74.027 kg (163 lb 3.2 oz)  SpO2: 91% 90% 92% 96%   I&O's:   Intake/Output Summary (Last 24 hours) at 05/25/11 1312 Last data filed at 05/25/11 1100  Gross per 24 hour  Intake    520 ml  Output   3200 ml  Net  -2680 ml   TELEMETRY: Reviewed telemetry pt in NSR:     PHYSICAL EXAM General: Well developed, well nourished, in no acute distress  Lungs:   Clear bilaterally to auscultation anteriorly. Heart:   HRRR S1 S2   Extremities: 1+ edema.  Neuro: Alert and oriented X 3. Psych:  Good affect, responds appropriately   LABS: Basic Metabolic Panel:  Basename 05/25/11 0500 05/24/11 0353 05/23/11 1555 05/23/11 0415  NA 133* 133* -- --  K 4.6 3.9 -- --  CL 103 101 -- --  CO2 21 26 -- --  GLUCOSE 113* 104* -- --  BUN 18 19 -- --  CREATININE 0.96 1.15 -- --  CALCIUM 8.9 8.5 -- --  MG -- -- 2.1 2.3  PHOS -- -- -- --   Liver Function Tests: No results found for this basename: AST:2,ALT:2,ALKPHOS:2,BILITOT:2,PROT:2,ALBUMIN:2 in the last 72 hours No results found for this basename: LIPASE:2,AMYLASE:2 in the last 72 hours CBC:  Basename 05/25/11 0500 05/24/11 0353  WBC 7.8 7.9  NEUTROABS -- --  HGB 9.9* 8.4*  HCT 28.4* 24.0*  MCV 92.2 92.3  PLT 72* 70*   Cardiac Enzymes: No results found for this basename: CKTOTAL:3,CKMB:3,CKMBINDEX:3,TROPONINI:3 in the last 72 hours BNP: No components found with this basename: POCBNP:3 D-Dimer: No results found for this basename: DDIMER:2 in the last 72 hours Hemoglobin A1C: No results found for this basename: HGBA1C in the last 72 hours Fasting Lipid Panel: No results found for this basename:  CHOL,HDL,LDLCALC,TRIG,CHOLHDL,LDLDIRECT in the last 72 hours Thyroid Function Tests: No results found for this basename: TSH,T4TOTAL,FREET3,T3FREE,THYROIDAB in the last 72 hours Anemia Panel: No results found for this basename: VITAMINB12,FOLATE,FERRITIN,TIBC,IRON,RETICCTPCT in the last 72 hours Coag Panel:   Lab Results  Component Value Date   INR 1.46 05/22/2011   INR 1.11 05/22/2011    RADIOLOGY: Dg Chest 2 View  05/21/2011  *RADIOLOGY REPORT*  Clinical Data: Preop respiratory evaluation for CABG.  CHEST - 2 VIEW  Comparison: 05/07/2004  Findings: The lungs are clear without focal infiltrate, edema, pneumothorax or pleural effusion.  There is mild bilateral hyperexpansion.  Nodular density in the left parahilar region was present previously and is probably a vessel on end. Imaged bony structures of the thorax are intact. Telemetry leads overlie the chest.  IMPRESSION: No acute cardiopulmonary findings.  Original Report Authenticated By: ERIC A. MANSELL, M.D.   Dg Chest Portable 1 View In Am  05/24/2011  *RADIOLOGY REPORT*  Clinical Data: Postop cardiac surgery.  PORTABLE CHEST - 1 VIEW  Comparison: 05/23/2011  Findings: Swan-Ganz catheter has been removed.  Right IJ sheath remains in place.  Left chest tube has been removed.  Patient has had median sternotomy and CABG.  There is no evidence for pneumothorax following  chest tube removal.  The heart is enlarged.  There is increased bibasilar atelectasis, left greater than right.  No evidence for pulmonary edema.  IMPRESSION:  1.  Interval removal of Swan-Ganz catheter, left chest tube. 2.  Increased bibasilar atelectasis.  Original Report Authenticated By: Patterson Hammersmith, M.D.   Dg Chest Portable 1 View In Am  05/23/2011  *RADIOLOGY REPORT*  Clinical Data: Status post CABG.  Chest discomfort.  PORTABLE CHEST - 1 VIEW  Comparison: Chest 05/22/2011.  Findings: Endotracheal tube and NG tube have been removed.  Support apparatus is otherwise  unchanged.  No pneumothorax is identified. Mild subsegmental atelectasis in the lung bases noted.  Heart size upper normal.  IMPRESSION:  1.  Status post extubation and NG tube removal. 2.  Mild basilar subsegmental atelectasis.  Original Report Authenticated By: Bernadene Bell. D'ALESSIO, M.D.   Dg Chest Portable 1 View  05/22/2011  *RADIOLOGY REPORT*  Clinical Data: Postop CABG  PORTABLE CHEST - 1 VIEW  Comparison: Chest x-ray of 05/21/2011  Findings: Median sternotomy sutures are now noted.  The tip of the endotracheal tube is approximately 5.9 cm above the carina.  A Swan- Ganz catheter is present with the tip in the right main pulmonary artery.  A left chest tube is present and no pneumothorax is seen. Linear atelectasis is present at both lung bases.  IMPRESSION: Status post CABG.  Endotracheal tip 5.9 cm above carina.  Left chest tube present with no pneumothorax.  Original Report Authenticated By: Juline Patch, M.D.      ASSESSMENT: CAD, s/p CABG.  PLAN:  Continue post CABG care.  No arrhythmias.  Statin was started prior to CABG.  Aggressive secondary prevention.  VARANASI,JAYADEEP S.  05/25/2011  1:12 PM

## 2011-05-26 ENCOUNTER — Encounter (HOSPITAL_COMMUNITY): Payer: Self-pay | Admitting: Cardiothoracic Surgery

## 2011-05-26 LAB — GLUCOSE, CAPILLARY
Glucose-Capillary: 100 mg/dL — ABNORMAL HIGH (ref 70–99)
Glucose-Capillary: 100 mg/dL — ABNORMAL HIGH (ref 70–99)
Glucose-Capillary: 102 mg/dL — ABNORMAL HIGH (ref 70–99)
Glucose-Capillary: 133 mg/dL — ABNORMAL HIGH (ref 70–99)
Glucose-Capillary: 97 mg/dL (ref 70–99)

## 2011-05-26 NOTE — Progress Notes (Signed)
CARDIAC REHAB PHASE I   PRE:  Rate/Rhythm: 75 SR  BP:  Supine:   Sitting: 110/58  Standing:    SaO2: 95% RA  MODE:  Ambulation: 450 ft   POST:  Rate/Rhythem: 85  BP:  Supine:   Sitting: 120/60  Standing:    SaO2: 95%RA 0930 - 1030 Pt ambulated with RW and 1 assist steady with one rest break. Tolerated well. Back to chair with feet up. Education done with pt and family with understanding. D/c video put on for pt and family. Permission for out pt rehab.   Derek Mound

## 2011-05-26 NOTE — Progress Notes (Addendum)
[address]            [place] [address]          [phone]     4 Days Post-Op  Procedure(s) (LRB): CORONARY ARTERY BYPASS GRAFTING (CABG) (N/A) Subjective: He continues to feel stronger. No shortness of breath. Minimal pain.  Objective  Telemetry NSR/STACH  Temp:  [98.3 F (36.8 C)-99.1 F (37.3 C)] 99 F (37.2 C) (12/18 0419) Pulse Rate:  [76-86] 81  (12/18 0419) Resp:  [18-20] 18  (12/18 0419) BP: (112-134)/(65-80) 134/80 mmHg (12/18 0419) SpO2:  [89 %-94 %] 94 % (12/18 0419) Weight:  [157 lb 3 oz (71.3 kg)] 157 lb 3 oz (71.3 kg) (12/18 0419)   Intake/Output Summary (Last 24 hours) at 05/26/11 0924 Last data filed at 05/26/11 0700  Gross per 24 hour  Intake   1463 ml  Output   4175 ml  Net  -2712 ml       General appearance: alert, cooperative and no distress Heart: regular rate and rhythm and S1, S2 normal Lungs: clear to auscultation bilaterally Abdomen: soft, non-tender; bowel sounds normal; no masses,  no organomegaly Extremities: minor edema Wound: healing well without signs of infection  Lab Results:  Basename 05/25/11 0500 05/24/11 0353 05/23/11 1555  NA 133* 133* --  K 4.6 3.9 --  CL 103 101 --  CO2 21 26 --  GLUCOSE 113* 104* --  BUN 18 19 --  CREATININE 0.96 1.15 --  CALCIUM 8.9 8.5 --  MG -- -- 2.1  PHOS -- -- --   No results found for this basename: AST:2,ALT:2,ALKPHOS:2,BILITOT:2,PROT:2,ALBUMIN:2 in the last 72 hours No results found for this basename: LIPASE:2,AMYLASE:2 in the last 72 hours  Basename 05/25/11 0500 05/24/11 0353  WBC 7.8 7.9  NEUTROABS -- --  HGB 9.9* 8.4*  HCT 28.4* 24.0*  MCV 92.2 92.3  PLT 72* 70*   No results found for this basename: CKTOTAL:4,CKMB:4,TROPONINI:4 in the last 72 hours No components found with this basename: POCBNP:3 No results found for this basename: DDIMER in the last 72 hours No results found for this basename: HGBA1C in the last 72 hours No results  found for this basename: CHOL,HDL,LDLCALC,TRIG,CHOLHDL in the last 72 hours No results found for this basename: TSH,T4TOTAL,FREET3,T3FREE,THYROIDAB in the last 72 hours No results found for this basename: VITAMINB12,FOLATE,FERRITIN,TIBC,IRON,RETICCTPCT in the last 72 hours  Medications: Scheduled    . docusate sodium  200 mg Oral Daily  . ferrous gluconate  324 mg Oral BID WC  . fish oil-omega-3 fatty acids  1 g Oral Daily  . FLUoxetine  20 mg Oral Daily  . folic acid  1 mg Oral Daily  . furosemide  40 mg Oral Daily  . pantoprazole  40 mg Oral QAC breakfast  . potassium chloride  40 mEq Oral Daily  . rosuvastatin  10 mg Oral q1800  . sodium chloride  3 mL Intravenous Q12H  . Tamsulosin HCl  0.4 mg Oral QPC breakfast  . DISCONTD: acetaminophen (TYLENOL) oral liquid 160 mg/5 mL  975 mg Per Tube Q6H  . DISCONTD: acetaminophen  1,000 mg Oral Q6H  . DISCONTD: aspirin  324 mg Per Tube Daily  . DISCONTD: aspirin EC  325 mg Oral Daily  . DISCONTD: bisacodyl  10 mg Oral Daily  . DISCONTD: bisacodyl  10 mg Rectal Daily  . DISCONTD: insulin aspart  0-24  Units Subcutaneous Q4H     Radiology/Studies:  No results found.  INR: Will add last result for INR, ABG once components are confirmed Will add last 4 CBG results once components are confirmed  Assessment/Plan: S/P Procedure(s) (LRB): CORONARY ARTERY BYPASS GRAFTING (CABG) (N/A)  1. Continues to feel better.  2. D/C EPW's today 3. Cont rehab 4. Poss d/c in am vs later today   LOS: 5 days    Charles Grant,Charles Grant 12/18/20129:24 AM    Walking better Plan dc in am I have seen and examined Charles Grant and agree with the above assessment  and plan.  Delight Ovens MD Beeper [phone] Office [phone] 05/26/2011 4:56 PM

## 2011-05-26 NOTE — Progress Notes (Signed)
Pacing wires pulled at 1145. Wires intact. Patient tolerated procedure well. No s/s of distress. Call bell within reach. Bedrest until 1245. Dion Saucier

## 2011-05-26 NOTE — Discharge Summary (Signed)
Charles Grant 03-08-1934 77 y.o. [id number]  05/21/2011   Delight Ovens, MD   History of Present Illness:  Patient with no previous history of mi or coronary disease. Feb of this year began having episodes of upper chest neck discomfort, worse with exertion, relief with rest. She was referred to Dr. Tyrone Sage in cardiothoracic surgical consultation. Current Activity/ Functional Status:  Patient Is independent with mobility/ambulation, transfers, ADL's, IADL's.  Past Medical History   Diagnosis  Date   .  Angina  Feb 2012   .  Heart murmur    .  Depression    .  Anemia    .  Coronary artery disease  05/21/2011    Past Surgical History   Procedure  Date   .  Tonsillectomy    .  Cardiac catheterization    Rt Rotator Cuff repair  Pre melanoma rt abdominal wall 08/2010 unknown final path/stage  History   Smoking status   .  Not on file   Smokeless tobacco   .  Never Used    History   Alcohol Use   .  6.0 oz/week   .  10 Glasses of wine per week    History    Social History   .  Marital Status:  Married     Spouse Name:      Number of Children:    .  Years of Education:     Occupational History   .  [employer] business    Social History Main Topics   .  Smoking status:  NoT smoker   .  Smokeless tobacco:  Never Used   .  Alcohol Use:  6.0 oz/week     10 Glasses of wine per week   .  Drug Use:  No   .  Sexually Active:  Yes    Social History Narrative   .  No narrative on file    Allergies   Allergen  Reactions   .  Penicillins  Hives, Swelling and Rash   .  Singulair  Other (See Comments)     When mixed with antidepressants there is a side effect    Current Facility-Administered Medications   Medication  Dose  Route  Frequency  Provider  Last Rate  Last Dose   .  rosuvastatin (CRESTOR) tablet 10 mg  10 mg  Oral  q1800  Corky Crafts      Facility-Administered Medications Ordered in Other Encounters   Medication  Dose  Route  Frequency  Provider   Last Rate  Last Dose   .  aspirin chewable tablet 324 mg  324 mg  Oral  Pre-Cath  Jayadeep S. Varanasi   324 mg at 05/21/11 1003   .  diazepam (VALIUM) tablet 5 mg  5 mg  Oral  On Call  Donnie Coffin. Varanasi   5 mg at 05/21/11 1003   .  DISCONTD: 0.9 % sodium chloride infusion   Intravenous  Continuous  Corky Crafts     .  DISCONTD: 0.9 % sodium chloride infusion  250 mL  Intravenous  PRN  Corky Crafts     .  DISCONTD: 0.9 % sodium chloride infusion  1 mL/kg/hr  Intravenous  Continuous  Corky Crafts     .  DISCONTD: acetaminophen (TYLENOL) tablet 650 mg  650 mg  Oral  Q4H PRN  Corky Crafts     .  DISCONTD: acetaminophen (TYLENOL) tablet 650 mg  650 mg  Oral  Q4H PRN  Corky Crafts     .  DISCONTD: aspirin chewable tablet 81 mg  81 mg  Oral  Daily  Corky Crafts     .  DISCONTD: morphine 2 MG/ML injection 1 mg  1 mg  Intravenous  Q1H PRN  Corky Crafts     .  DISCONTD: ondansetron (ZOFRAN) injection 4 mg  4 mg  Intravenous  Q6H PRN  Corky Crafts     .  DISCONTD: ondansetron (ZOFRAN) injection 4 mg  4 mg  Intravenous  Q6H PRN  Corky Crafts     .  DISCONTD: sodium chloride 0.9 % injection 3 mL  3 mL  Intravenous  Q12H  Corky Crafts     .  DISCONTD: sodium chloride 0.9 % injection 3 mL  3 mL  Intravenous  PRN  Corky Crafts      Prescriptions prior to admission   Medication  Sig  Dispense  Refill   .  aspirin EC 81 MG tablet  Take 81 mg by mouth daily.     .  fish oil-omega-3 fatty acids 1000 MG capsule  Take 1 g by mouth daily.     Marland Kitchen  FLUoxetine (PROZAC) 20 MG capsule  Take 20 mg by mouth daily.     .  folic acid (FOLVITE) 1 MG tablet  Take 1 mg by mouth daily.     .  Tamsulosin HCl (FLOMAX) 0.4 MG CAPS  Take 0.4 mg by mouth daily after breakfast.      Family History:fahter deceased pancreatic ca  Mother deceased hypertension 25  Review of Systems: At time of consultation Cardiac Review of Systems: Y or N    Chest Pain [ y ] Resting SOB [n ] Exertional SOB [n ] Orthopnea [ n ]  Pedal Edema [n ] Palpitations [ n ] Syncope [ n ] Presyncope [ n ]  General Review of Systems: [Y] = yes [ ] =no  Constitional: recent weight change [ n ]; anorexia [ ] ; fatigue [ ] ; nausea [ n ]; night sweats [ n ]; fever [ n]; or chills [ n ];  Dental: poor dentition[ n ];  Eye : blurred vision [ ] ; diplopia [ ] ; vision changes [ ] ; Amaurosis fugax[ ] ;  Resp: cough [ ] ; wheezing[ ] ; hemoptysis[ ] ; shortness of breath[ ] ; paroxysmal nocturnal dyspnea[ ] ; dyspnea on exertion[ ] ; or orthopnea[ ] ;  GI: gallstones[ ] , vomiting[ ] ; dysphagia[ ] ; melena[ ] ; hematochezia [ ] ; heartburn[ ] ; Hx of Colonoscopy[ ] ;  GU: kidney stones [ ] ; hematuria[ ] ; dysuria [ ] ; nocturia[ ] ; history of obstruction [ y ];  Skin: rash, swelling[ ] ;, hair loss[ ] ; peripheral edema[ ] ; or itching[ ] ;  Musculosketetal: myalgias[ ] ; joint swelling[ ] ; joint erythema[ ] ;  joint pain[ ] ; back pain[ ] ;  Heme/Lymph: bruising[ ] ; bleeding[ ] ; anemia[ ] ;  Neuro: TIA[ ] ; headaches[ ] ; stroke[ ] ; vertigo[ ] ; seizures[ ] ; paresthesias[ ] ; difficulty walking[ ] ;  Psych:depression[ ] ; anxiety[ ] ;  Endocrine: diabetes[ ] ; thyroid dysfunction[ ] ;  Immunizations: Flu [ ] ; Pneumococcal[ ] ;  Other: negative  Physical Exam: At time of consultation  BP 133/79  Pulse 62  Temp 97.9 F (36.6 C)  Resp 16  SpO2 98%  General appearance: alert, cooperative, appears stated age and no distress  Neurologic: intact  Heart: regular rate and rhythm, S1, S2 normal, no murmur, click, rub or gallop  Lungs: clear to auscultation bilaterally  Abdomen: soft, non-tender; bowel sounds normal; no masses, no organomegaly  Extremities: extremities normal, atraumatic, no cyanosis or edema, Homans sign is negative, no sign of DVT, no edema, redness or tenderness in the calves or thighs and no ulcers, gangrene or trophic changes  rt cath site with out hematoma  No carotid bruits, no  palpable AAA  Veins in lower leg ok for bypass  Diagnostic Studies & Laboratory data:  Recent Radiology Findings:  No results found.  Recent Lab Findings:  No results found for this basename: WBC, HGB, HCT, PLT, GLUCOSE, CHOL, TRIG, HDL, LDLDIRECT, LDLCALC, ALT, AST, NA, K, CL, CREATININE, BUN, CO2, TSH, INR, GLUF, HGBA1C      PROCEDURE: Left heart catheterization with selective coronary angiography, left ventriculogram, abdominal aortogram.  INDICATIONS:  The risks, benefits, and details of the procedure were explained to the patient. The patient verbalized understanding and wanted to proceed. Informed written consent was obtained.  PROCEDURE TECHNIQUE: After Xylocaine anesthesia a 23F sheath was placed in the right femoral artery with a single anterior needle wall stick. Left coronary angiography was done using a Judkins L4 guide catheter. Right coronary angiography was done using a Judkins R4 guide catheter. Left ventriculography was done using a pigtail catheter. The pigtail catheter was withdrawn to the abdominal aorta and a power injection of contrast was performed  CONTRAST: Total of 75 cc.  COMPLICATIONS: None.  HEMODYNAMICS: Aortic pressure was 130/65, mean aortic pressure 92; LV pressure was 132/8; LVEDP 13. There was no gradient between the left ventricle and aorta.  ANGIOGRAPHIC DATA: The left main coronary artery is heavily calcified. There is a distal 60% stenosis.  The left anterior descending artery is a large vessel. There is an ostial 70% stenosis which is also heavily calcified. The entire proximal vessel is very heavily calcified. Just before the first diagonal, there is a 60% calcified stenosis. There 2 medium-sized diagonals both of which had severe ostial to proximal disease. In the mid LAD, there are sequential 80% stenoses separated by a short more patent segment. The distal LAD appears widely patent.  The left circumflex artery is a large vessel. There is heavy  calcification in the ostial to proximal section. There is a 95% stenosis which starts right at the distal left main. There is a medium-sized OM 1 which is patent. There is a 75% stenosis just after the OM1. There is a large branching second obtuse marginal. This vessel appears widely patent.  The right coronary artery is a large dominant vessel which is diffusely calcified. There is a 25% distal stenosis. There is no flow limiting disease in the right coronary artery.  LEFT VENTRICULOGRAM: Left ventricular angiogram was done in the 30 RAO projection and revealed normal left ventricular wall motion and systolic function with an estimated ejection fraction of 60 %. LVEDP was 13 mmHg.  ABDOMINAL AORTOGRAM: No abdominal aortic aneurysm. Dual renal arterial supply to both kidneys. No significant aortoiliac disease.  IMPRESSIONS:  1. Significant, calcific distal left main coronary artery disease. 2. Heavily calcified left anterior descending proximally with a 70% ostial stenosis, and 80% mid vessel stenoses. Diffuse disease noted in both diagonal vessels. 3. Heavily calcified ostial circumflex with 95% stenosis. Patent obtuse marginal vessels. 4. Mild disease in right coronary artery. 5. Normal left ventricular systolic function. LVEDP 60 mmHg. Ejection fraction 13 %. 6. No abdominal aortic aneurysm. No renal artery stenosis.  RECOMMENDATION: The patient will be admitted and evaluated by cardiothoracic surgery for possible bypass surgery. Will start  statin along with other secondary prevention.   Correction on my review lv function is normal not 13%    Hospital Course:  The patient was taken the operating room on 05/22/2011 and underwent the following procedure:  OPERATIVE REPORT  PREOPERATIVE DIAGNOSIS: Coronary occlusive disease with critical left  main.  POSTOPERATIVE DIAGNOSIS: Coronary occlusive disease with critical left  main.  SURGICAL PROCEDURE: Coronary artery bypass grafting x5 with the  left  internal mammary to the left anterior descending coronary artery,  sequential reverse saphenous vein graft to the first and second  diagonal, sequential reverse saphenous vein graft to the first and  second obtuse marginal, with right leg endo-vein harvesting.  SURGEON: Sheliah Plane, MD  FIRST ASSISTANT: Doree Fudge, PA   Patient tolerated procedure well was taken to the surgical intensive care unit in stable condition.  Postoperative hospital course:  The patient has progressed quite nicely. He was weaned from the ventilator without difficulty neurologically intact. He has remained hemodynamically stable without significant cardiac dysrhythmias. All routine lines, monitors, drainage devices have all been discontinued in the standard fashion. He is tolerating routine advancement activities using standard cardiac rehabilitation protocols. Oxygen has been weaned and he maintains good saturations on room air. Incisions are healing well without evidence of infection. He does have some mild postoperative volume overload but is responding well to diuretics. He does have an acute blood loss anemia as well as thrombocytopenia but values have stabilized [ please see below]. He is tentatively felt to be stable for discharge in the morning of 05/27/2011 pending reevaluation during rounds.    Basename 05/25/11 0500 05/24/11 0353  NA 133* 133*  K 4.6 3.9  CL 103 101  CO2 21 26  GLUCOSE 113* 104*  BUN 18 19  CALCIUM 8.9 8.5    Basename 05/25/11 0500 05/24/11 0353  WBC 7.8 7.9  HGB 9.9* 8.4*  HCT 28.4* 24.0*  PLT 72* 70*   No results found for this basename: INR:2 in the last 72 hours   Discharge Instructions:  The patient is discharged to home with extensive instructions on wound care and progressive ambulation.  They are instructed not to drive or perform any heavy lifting until returning to see the physician in his office.  Discharge Diagnosis:  CAD CAD  Secondary  Diagnosis: Patient Active Problem List  Diagnoses  . Coronary artery disease   Past Medical History  Diagnosis Date  . Angina   . Heart murmur   . Depression   . Anemia   . Coronary artery disease 05/21/2011       Patrich, Heinze [facility]  Home Medication Instructions [id number]   Printed on:05/26/11 0931  Medication Information                    Tamsulosin HCl (FLOMAX) 0.4 MG CAPS Take 0.4 mg by mouth daily after breakfast.             FLUoxetine (PROZAC) 20 MG capsule Take 20 mg by mouth daily.             aspirin EC 81 MG tablet Take 81 mg by mouth daily.             folic acid (FOLVITE) 1 MG tablet Take 1 mg by mouth daily.             fish oil-omega-3 fatty acids 1000 MG capsule Take 1 g by mouth daily.  ferrous gluconate (FERGON) 324 MG tablet Take 1 tablet (324 mg total) by mouth 2 (two) times daily with a meal.           potassium chloride SA (K-DUR,KLOR-CON) 20 MEQ tablet Take 1 tablet (20 mEq total) by mouth daily.           furosemide (LASIX) 40 MG tablet Take 1 tablet (40 mg total) by mouth daily.           rosuvastatin (CRESTOR) 10 MG tablet Take 1 tablet (10 mg total) by mouth daily at 6 PM.           traMADol (ULTRAM) 50 MG tablet Take 1 tablet (50 mg total) by mouth every 6 (six) hours as needed. Maximum dose= 8 tablets per day             Disposition: Discharged home  Patient's condition is Good  Gershon Crane, PA-C 05/26/2011  9:31 AM

## 2011-05-27 ENCOUNTER — Encounter: Payer: Self-pay | Admitting: *Deleted

## 2011-05-27 NOTE — Progress Notes (Signed)
5 Days Post-Op Procedure(s) (LRB): CORONARY ARTERY BYPASS GRAFTING (CABG) (N/A)  Subjective: Feels well, wants to go home.  Objective: Vital signs in last 24 hours: Patient Vitals for the past 24 hrs:  BP Temp Temp src Pulse Resp SpO2 Weight  05/27/11 0630 98/57 mmHg 99 F (37.2 C) Oral 84  20  94 % 153 lb 12.8 oz (69.763 kg)  05/26/11 2103 124/72 mmHg 99 F (37.2 C) Oral 77  20  96 % -  05/26/11 1504 115/66 mmHg - - 77  20  94 % -  05/26/11 1326 92/55 mmHg 97.5 F (36.4 C) Oral 86  20  97 % -  05/26/11 1318 115/65 mmHg 98.3 F (36.8 C) Oral 77  18  97 % -  05/26/11 1245 126/70 mmHg 98 F (36.7 C) Oral 72  20  94 % -  05/26/11 1230 127/73 mmHg 98.1 F (36.7 C) Oral 76  20  94 % -  05/26/11 1215 126/68 mmHg 98.2 F (36.8 C) Oral 74  18  94 % -  05/26/11 1200 134/72 mmHg 98.1 F (36.7 C) Oral 77  18  92 % -  05/26/11 1131 124/67 mmHg 98.1 F (36.7 C) Oral 76  18  94 % -   Current Weight  05/27/11 153 lb 12.8 oz (69.763 kg)     Intake/Output from previous day: 12/18 0701 - 12/19 0700 In: 960 [P.O.:960] Out: 2550 [Urine:2550]    PHYSICAL EXAM:  Heart: RRR Lungs: clear Wound: clean and dry Extremities: trace LE edema  Lab Results: CBC: Basename 05/25/11 0500  WBC 7.8  HGB 9.9*  HCT 28.4*  PLT 72*   BMET:  Basename 05/25/11 0500  NA 133*  K 4.6  CL 103  CO2 21  GLUCOSE 113*  BUN 18  CREATININE 0.96  CALCIUM 8.9    PT/INR: No results found for this basename: LABPROT,INR in the last 72 hours   Assessment/Plan: S/P Procedure(s) (LRB): CORONARY ARTERY BYPASS GRAFTING (CABG) (N/A) Plan for discharge: see discharge orders   LOS: 6 days    Aylah Yeary H 05/27/2011

## 2011-05-27 NOTE — Progress Notes (Signed)
Pt and family given discharge instructions.  S/SX infection and when to call the MD.  Pt and family verbalized understanding.  Will follow up with MD.Charles Grant, Lesly Rubenstein

## 2011-05-29 NOTE — Progress Notes (Signed)
   CARE MANAGEMENT NOTE 05/29/2011  Patient:  Charles Grant, Charles Grant   Account Number:  [id number]  Date Initiated:  05/25/2011  Documentation initiated by:  [facility]  Subjective/Objective Assessment:   Post op CABG on 05-22-11     Action/Plan:   PTA, PT INDEPENDENT, LIVES WITH SPOUSE.   Anticipated DC Date:  05/28/2011   Anticipated DC Plan:  HOME W HOME HEALTH SERVICES      DC Planning Services  CM consult      Choice offered to / List presented to:             Status of service:  Completed, signed off Medicare Important Message given?   (If response is "NO", the following Medicare IM given date fields will be blank) Date Medicare IM given:   Date Additional Medicare IM given:    Discharge Disposition:  HOME/SELF CARE  Per UR Regulation:  Reviewed for med. necessity/level of care/duration of stay  Comments:  05/27/11 Kaito Schulenburg,RN,BSN PT DISCHARGING TO HOME TODAY WITH SPOUSE.  NO HOME HEALTH OR DME NEEDED.  05-26-11 8am Avie Arenas, RNBSN [phone] UR completed.  05-25-11 10:40am Avie Arenas, RNBSN [phone] UR completed.

## 2011-06-10 DIAGNOSIS — E7800 Pure hypercholesterolemia, unspecified: Secondary | ICD-10-CM | POA: Diagnosis not present

## 2011-06-10 DIAGNOSIS — I2510 Atherosclerotic heart disease of native coronary artery without angina pectoris: Secondary | ICD-10-CM | POA: Diagnosis not present

## 2011-06-18 ENCOUNTER — Ambulatory Visit: Payer: Medicare Other | Admitting: Cardiothoracic Surgery

## 2011-06-18 ENCOUNTER — Ambulatory Visit (HOSPITAL_COMMUNITY): Payer: Medicare Other

## 2011-06-19 ENCOUNTER — Other Ambulatory Visit: Payer: Self-pay | Admitting: Cardiothoracic Surgery

## 2011-06-19 DIAGNOSIS — I2510 Atherosclerotic heart disease of native coronary artery without angina pectoris: Secondary | ICD-10-CM

## 2011-06-22 ENCOUNTER — Ambulatory Visit (HOSPITAL_COMMUNITY): Payer: Medicare Other

## 2011-06-23 ENCOUNTER — Ambulatory Visit (INDEPENDENT_AMBULATORY_CARE_PROVIDER_SITE_OTHER): Payer: Self-pay | Admitting: Cardiothoracic Surgery

## 2011-06-23 ENCOUNTER — Encounter: Payer: Self-pay | Admitting: Cardiothoracic Surgery

## 2011-06-23 ENCOUNTER — Ambulatory Visit
Admission: RE | Admit: 2011-06-23 | Discharge: 2011-06-23 | Disposition: A | Discharge status: Home / Self Care | Payer: Medicare Other | Source: Ambulatory Visit | Attending: Cardiothoracic Surgery | Admitting: Cardiothoracic Surgery

## 2011-06-23 VITALS — BP 122/79 | HR 85 | Resp 18 | Ht 68.0 in | Wt 146.0 lb

## 2011-06-23 DIAGNOSIS — I2510 Atherosclerotic heart disease of native coronary artery without angina pectoris: Secondary | ICD-10-CM

## 2011-06-23 DIAGNOSIS — Z951 Presence of aortocoronary bypass graft: Secondary | ICD-10-CM

## 2011-06-23 NOTE — Progress Notes (Signed)
[address]            [place] [address]          [phone]       Charles Grant [facility] Medical Record #[id number] Date of Birth: [birth year]  Charles Grant., * Charles Bos, MD, MD  Chief Complaint:   PostOp Follow Up Visit 05/21/2011 PREOPERATIVE DIAGNOSIS: Coronary occlusive disease with critical left  main.  POSTOPERATIVE DIAGNOSIS: Coronary occlusive disease with critical left  main.  SURGICAL PROCEDURE: Coronary artery bypass grafting x5 with the left  internal mammary to the left anterior descending coronary artery,  sequential reverse saphenous vein graft to the first and second  diagonal, sequential reverse saphenous vein graft to the first and  second obtuse marginal, with right leg endo-vein harvesting.   History of Present Illness:     Patient returns after his recent coronary artery bypass grafting. He is approximately 5 weeks postop. He's had no recurrent angina or evidence of congestive heart failure. He notes fatigue for several weeks at home but this is improving. He wants to travel to [place] next week.     History  Smoking status  . Never Smoker   Smokeless tobacco  . Never Used       Allergies  Allergen Reactions  . Penicillins Hives, Swelling and Rash  . Singulair Other (See Comments)    When mixed with antidepressants there is a side effect    Current Outpatient Prescriptions  Medication Sig Dispense Refill  . aspirin EC 81 MG tablet Take 81 mg by mouth daily.        . Cinnamon 500 MG capsule Take 250 mg by mouth daily.      . ferrous gluconate (FERGON) 324 MG tablet Take 1 tablet (324 mg total) by mouth 2 (two) times daily with a meal.  60 tablet  1  . fish oil-omega-3 fatty acids 1000 MG capsule Take 1 g by mouth daily.        Marland Kitchen FLUoxetine (PROZAC) 20 MG capsule Take 20 mg by mouth daily.        . folic acid (FOLVITE) 1 MG tablet Take 1 mg by mouth daily.        . sildenafil  (VIAGRA) 100 MG tablet Take 100 mg by mouth daily as needed.      . Tamsulosin HCl (FLOMAX) 0.4 MG CAPS Take 0.4 mg by mouth daily after breakfast.        . traMADol (ULTRAM) 50 MG tablet Take 1 tablet (50 mg total) by mouth every 6 (six) hours as needed. Maximum dose= 8 tablets per day  50 tablet  0  . rosuvastatin (CRESTOR) 10 MG tablet Take 1 tablet (10 mg total) by mouth daily at 6 PM.  30 tablet  1       Physical Exam: BP 122/79  Pulse 85  Resp 18  Ht 5\' 8"  (1.727 m)  Wt 146 lb (66.225 kg)  BMI 22.20 kg/m2  SpO2 98%  General appearance: alert, cooperative and appears stated age Neurologic: intact Heart: regular rate and rhythm, S1, S2 normal, no murmur, click, rub or gallop Lungs: clear to auscultation bilaterally Abdomen: soft, non-tender; bowel sounds normal; no masses,  no organomegaly Extremities: extremities normal, atraumatic, no cyanosis or edema, Homans sign is negative, no sign of DVT and no edema, redness or tenderness in the calves  or thighs Wound: sternum stable   Diagnostic Studies & Laboratory data:         Recent Radiology Findings: Dg Chest 2 View  06/23/2011  *RADIOLOGY REPORT*  Clinical Data: Recent CABG.  CHEST - 2 VIEW  Comparison: 05/24/2011 and 05/21/2011  Findings: The heart size and vascularity are normal.  Atelectasis and effusions have completely resolved.  No pneumothorax.  No acute osseous abnormality.  IMPRESSION: No acute abnormalities.  Original Report Authenticated By: Gwynn Burly, M.D.      Recent Labs: Lab Results  Component Value Date   WBC 7.8 05/25/2011   HGB 9.9* 05/25/2011   HCT 28.4* 05/25/2011   PLT 72* 05/25/2011   GLUCOSE 113* 05/25/2011   NA 133* 05/25/2011   K 4.6 05/25/2011   CL 103 05/25/2011   CREATININE 0.96 05/25/2011   BUN 18 05/25/2011   CO2 21 05/25/2011   INR 1.46 05/22/2011   HGBA1C 5.5 05/21/2011      Assessment / Plan:     Patient is making good progress following bypass surgery for critical  left main. He will start in cardiac rehabilitation in one week I've reviewed with him to avoid any heavy lifting for several months but have allowed him to return to driving and also to travel to [place]. I've not made him a return appointment to see me but would be glad to see him in his or cardiology request.      Charles Ovens MD 06/23/2011 5:41 PM

## 2011-06-24 ENCOUNTER — Ambulatory Visit (HOSPITAL_COMMUNITY): Payer: Medicare Other

## 2011-06-25 ENCOUNTER — Encounter (HOSPITAL_COMMUNITY): Payer: Self-pay

## 2011-06-25 ENCOUNTER — Encounter (HOSPITAL_COMMUNITY)
Admission: RE | Admit: 2011-06-25 | Discharge: 2011-06-25 | Disposition: A | Discharge status: Home / Self Care | Payer: Medicare Other | Source: Ambulatory Visit | Attending: Interventional Cardiology | Admitting: Interventional Cardiology

## 2011-06-25 ENCOUNTER — Other Ambulatory Visit: Payer: Self-pay | Admitting: *Deleted

## 2011-06-26 ENCOUNTER — Ambulatory Visit (HOSPITAL_COMMUNITY): Payer: Medicare Other

## 2011-06-29 ENCOUNTER — Ambulatory Visit (HOSPITAL_COMMUNITY): Payer: Medicare Other

## 2011-07-01 ENCOUNTER — Ambulatory Visit (HOSPITAL_COMMUNITY): Payer: Medicare Other

## 2011-07-03 ENCOUNTER — Ambulatory Visit (HOSPITAL_COMMUNITY): Payer: Medicare Other

## 2011-07-06 ENCOUNTER — Encounter (HOSPITAL_COMMUNITY): Payer: Medicare Other

## 2011-07-06 ENCOUNTER — Ambulatory Visit (HOSPITAL_COMMUNITY): Payer: Medicare Other

## 2011-07-08 ENCOUNTER — Encounter (HOSPITAL_COMMUNITY): Admission: RE | Admit: 2011-07-08 | Discharge: 2011-07-08 | Payer: Medicare Other | Source: Ambulatory Visit

## 2011-07-08 ENCOUNTER — Ambulatory Visit (HOSPITAL_COMMUNITY): Payer: Medicare Other

## 2011-07-08 NOTE — Progress Notes (Signed)
Charles Grant 78 y.o. male       Nutrition Screen                                                                    YES  NO Do you live in a nursing home?  X   Do you eat out more than 3 times/week?   X  If yes, how many times per week do you eat out? 3-6  Do you have food allergies?   X If yes, what are you allergic to?  Have you gained or lost more than 10 lbs without trying?               X If yes, how much weight have you lost and over what time period?  lbs gained or lost over  weeks/month  Do you want to lose weight?     X If yes, what is a goal weight or amount of weight you would like to lose?  Do you eat alone most of the time?   X   Do you eat less than 2 meals/day?  X If yes, how many meals do you eat?  Do you drink more than 3 alcohol drinks/day?  X If yes, how many drinks per day?  Are you having trouble with constipation? *  X If yes, what are you doing to help relieve constipation?  Do you have financial difficulties with buying food?*    X   Are you experiencing regular nausea/ vomiting?*     X   Do you have a poor appetite? *                                        X   Do you have trouble chewing/swallowing? *   X    Pt with diagnoses of:  X CABG              X Dyslipidemia  / HDL< 40 / LDL>70 / High TG      X %  Body fat >goal / Body Mass Index >25        Pt Risk Score   1       Diagnosis Risk Score  15       Total Risk Score   16                         High Risk               X Low Risk              HT: 67.25" Ht Readings from Last 1 Encounters:  06/25/11 5' 7.25" (1.708 m)    WT:   150 lb (68.2 kg) Wt Readings from Last 3 Encounters:  06/25/11 150 lb 5.7 oz (68.2 kg)  06/23/11 146 lb (66.225 kg)  05/27/11 153 lb 12.8 oz (69.763 kg)     IBW 68 100%IBW BMI 23.4 24.7%body fat   Meds reviewed: Ferrous Gluconate, Fish oil, Folic Acid, Cinnamon  Past Medical History  Diagnosis Date  . Angina   . Heart murmur   .  Depression   . Anemia   . Coronary  artery disease 05/21/2011       Activity level: Pt is active  Wt goal: 150 lb ( 68.2 kg) Current tobacco use? No      Food/Drug Interaction? No      Labs:  Lipid Panel  No results found for this basename: chol, trig, hdl, cholhdl, vldl, ldlcalc   Lab Results  Component Value Date   HGBA1C 5.5 05/21/2011   05/25/11 Glucose 113   LDL goal: < 100      MI, DM, Carotid or PVD and > 2: > 78 yo male  Estimated Daily Nutrition Needs for: ? wt maintenance 2050-2350 Kcal , Total Fat 65-75gm, Saturated Fat 15-18 gm, Trans Fat 2.3-2.6 gm,  Sodium less than 1500 mg  Diet: Vegetarian

## 2011-07-10 ENCOUNTER — Encounter (HOSPITAL_COMMUNITY): Payer: Medicare Other

## 2011-07-10 ENCOUNTER — Ambulatory Visit (HOSPITAL_COMMUNITY): Payer: Medicare Other

## 2011-07-10 ENCOUNTER — Other Ambulatory Visit: Payer: Self-pay | Admitting: Surgical

## 2011-07-13 ENCOUNTER — Encounter (HOSPITAL_COMMUNITY): Payer: Medicare Other

## 2011-07-13 ENCOUNTER — Ambulatory Visit (HOSPITAL_COMMUNITY): Payer: Medicare Other

## 2011-07-15 ENCOUNTER — Ambulatory Visit (HOSPITAL_COMMUNITY): Payer: Medicare Other

## 2011-07-15 ENCOUNTER — Encounter (HOSPITAL_COMMUNITY): Payer: Medicare Other

## 2011-07-15 ENCOUNTER — Encounter (HOSPITAL_COMMUNITY)
Admission: RE | Admit: 2011-07-15 | Discharge: 2011-07-15 | Disposition: A | Discharge status: Home / Self Care | Payer: Medicare Other | Source: Ambulatory Visit | Attending: Interventional Cardiology | Admitting: Interventional Cardiology

## 2011-07-15 DIAGNOSIS — I2510 Atherosclerotic heart disease of native coronary artery without angina pectoris: Secondary | ICD-10-CM | POA: Diagnosis not present

## 2011-07-15 DIAGNOSIS — Z7982 Long term (current) use of aspirin: Secondary | ICD-10-CM | POA: Insufficient documentation

## 2011-07-15 DIAGNOSIS — I209 Angina pectoris, unspecified: Secondary | ICD-10-CM | POA: Insufficient documentation

## 2011-07-15 DIAGNOSIS — E7800 Pure hypercholesterolemia, unspecified: Secondary | ICD-10-CM | POA: Diagnosis not present

## 2011-07-15 DIAGNOSIS — Z5189 Encounter for other specified aftercare: Secondary | ICD-10-CM | POA: Diagnosis not present

## 2011-07-15 DIAGNOSIS — F329 Major depressive disorder, single episode, unspecified: Secondary | ICD-10-CM | POA: Insufficient documentation

## 2011-07-15 DIAGNOSIS — F3289 Other specified depressive episodes: Secondary | ICD-10-CM | POA: Insufficient documentation

## 2011-07-15 DIAGNOSIS — Z951 Presence of aortocoronary bypass graft: Secondary | ICD-10-CM | POA: Diagnosis not present

## 2011-07-15 NOTE — Progress Notes (Signed)
Cardiac Rehab - 9:45 exercise session Pt started cardiac rehab today.  Pt tolerated light exercise without difficulty.  Monitor showed Sr with negative QRS no other ectopy noted. Ekg strips similar to 12 lead ekg completed at the hospital on 05/21/12.  Medication list reconciled.  Continue to monitor.

## 2011-07-17 ENCOUNTER — Encounter (HOSPITAL_COMMUNITY)
Admission: RE | Admit: 2011-07-17 | Discharge: 2011-07-17 | Disposition: A | Discharge status: Home / Self Care | Payer: Medicare Other | Source: Ambulatory Visit | Attending: Interventional Cardiology | Admitting: Interventional Cardiology

## 2011-07-17 ENCOUNTER — Ambulatory Visit (HOSPITAL_COMMUNITY): Payer: Medicare Other

## 2011-07-17 ENCOUNTER — Encounter (HOSPITAL_COMMUNITY): Payer: Medicare Other

## 2011-07-20 ENCOUNTER — Encounter (HOSPITAL_COMMUNITY)
Admission: RE | Admit: 2011-07-20 | Discharge: 2011-07-20 | Disposition: A | Discharge status: Home / Self Care | Payer: Medicare Other | Source: Ambulatory Visit | Attending: Interventional Cardiology | Admitting: Interventional Cardiology

## 2011-07-20 ENCOUNTER — Ambulatory Visit (HOSPITAL_COMMUNITY): Payer: Medicare Other

## 2011-07-20 ENCOUNTER — Encounter (HOSPITAL_COMMUNITY): Payer: Medicare Other

## 2011-07-20 NOTE — Progress Notes (Signed)
Reviewed home exercise with pt today.  Pt plans to walk and ride bike at home for exercise.  Reviewed THR, pulse (needs practice), RPE, sign and symptoms, and when to call 911 or MD.  Pt voiced understanding. Fabio Pierce, MA, ACSM RCEP Academic Intern Ovid Curd

## 2011-07-22 ENCOUNTER — Ambulatory Visit (HOSPITAL_COMMUNITY): Payer: Medicare Other

## 2011-07-22 ENCOUNTER — Encounter (HOSPITAL_COMMUNITY): Payer: Medicare Other

## 2011-07-22 ENCOUNTER — Encounter (HOSPITAL_COMMUNITY)
Admission: RE | Admit: 2011-07-22 | Discharge: 2011-07-22 | Disposition: A | Discharge status: Home / Self Care | Payer: Medicare Other | Source: Ambulatory Visit | Attending: Interventional Cardiology | Admitting: Interventional Cardiology

## 2011-07-22 DIAGNOSIS — D649 Anemia, unspecified: Secondary | ICD-10-CM | POA: Diagnosis not present

## 2011-07-22 DIAGNOSIS — F329 Major depressive disorder, single episode, unspecified: Secondary | ICD-10-CM | POA: Diagnosis not present

## 2011-07-22 DIAGNOSIS — J309 Allergic rhinitis, unspecified: Secondary | ICD-10-CM | POA: Diagnosis not present

## 2011-07-24 ENCOUNTER — Encounter (HOSPITAL_COMMUNITY)
Admission: RE | Admit: 2011-07-24 | Discharge: 2011-07-24 | Disposition: A | Discharge status: Home / Self Care | Payer: Medicare Other | Source: Ambulatory Visit | Attending: Interventional Cardiology | Admitting: Interventional Cardiology

## 2011-07-24 ENCOUNTER — Ambulatory Visit (HOSPITAL_COMMUNITY): Payer: Medicare Other

## 2011-07-24 ENCOUNTER — Encounter (HOSPITAL_COMMUNITY): Payer: Medicare Other

## 2011-07-24 NOTE — Progress Notes (Signed)
Charles Grant 78 y.o. male Nutrition Note  Spoke with pt.  Nutrition Plan and Nutrition Survey reviewed with pt. Pt is following Step 2 of the Therapeutic Lifestyle Changes diet. Pt is a vegetarian and eats some fish, but little to no dairy products.  Per nutrition survey and pt food record, there is little to no room for improvement in pt diet. Nutrition Diagnosis   Food-and nutrition-related knowledge deficit related to lack of exposure to information as related to diagnosis of: ? CVD   Nutrition RX/ Estimated Daily Nutrition Needs for: wt maintenance 2050-2350 Kcal, 65-75 gm fat, 15-18 gm sat fat, 2.3-2.6 gm trans-fat, <1500 mg sodium  Nutrition Intervention   Pt's individual nutrition plan including cholesterol goals reviewed with pt.   Benefits of adopting Therapeutic Lifestyle Changes discussed when Medficts reviewed.   Pt to attend the Portion Distortion class   Pt to attend the  ? Nutrition I class                           ? Nutrition II class    Continue client-centered nutrition education by RD, as part of interdisciplinary care. Goal(s)   Pt to describe the potential heart health benefits of continuing his vegetarian, Therapeutic Lifestyle Changes diet Monitor and Evaluate progress toward nutrition goal with team.

## 2011-07-27 ENCOUNTER — Encounter (HOSPITAL_COMMUNITY)
Admission: RE | Admit: 2011-07-27 | Discharge: 2011-07-27 | Disposition: A | Discharge status: Home / Self Care | Payer: Medicare Other | Source: Ambulatory Visit | Attending: Interventional Cardiology | Admitting: Interventional Cardiology

## 2011-07-27 ENCOUNTER — Encounter (HOSPITAL_COMMUNITY): Payer: Medicare Other

## 2011-07-27 ENCOUNTER — Ambulatory Visit (HOSPITAL_COMMUNITY): Payer: Medicare Other

## 2011-07-29 ENCOUNTER — Encounter (HOSPITAL_COMMUNITY)
Admission: RE | Admit: 2011-07-29 | Discharge: 2011-07-29 | Disposition: A | Discharge status: Home / Self Care | Payer: Medicare Other | Source: Ambulatory Visit | Attending: Interventional Cardiology | Admitting: Interventional Cardiology

## 2011-07-29 ENCOUNTER — Ambulatory Visit (HOSPITAL_COMMUNITY): Payer: Medicare Other

## 2011-07-29 ENCOUNTER — Encounter (HOSPITAL_COMMUNITY): Payer: Medicare Other

## 2011-07-31 ENCOUNTER — Ambulatory Visit (HOSPITAL_COMMUNITY): Payer: Medicare Other

## 2011-07-31 ENCOUNTER — Encounter (HOSPITAL_COMMUNITY)
Admission: RE | Admit: 2011-07-31 | Discharge: 2011-07-31 | Disposition: A | Discharge status: Home / Self Care | Payer: Medicare Other | Source: Ambulatory Visit | Attending: Interventional Cardiology | Admitting: Interventional Cardiology

## 2011-07-31 ENCOUNTER — Encounter (HOSPITAL_COMMUNITY): Payer: Medicare Other

## 2011-08-03 ENCOUNTER — Encounter (HOSPITAL_COMMUNITY)
Admission: RE | Admit: 2011-08-03 | Discharge: 2011-08-03 | Disposition: A | Discharge status: Home / Self Care | Payer: Medicare Other | Source: Ambulatory Visit | Attending: Interventional Cardiology | Admitting: Interventional Cardiology

## 2011-08-03 ENCOUNTER — Ambulatory Visit (HOSPITAL_COMMUNITY): Payer: Medicare Other

## 2011-08-03 ENCOUNTER — Encounter (HOSPITAL_COMMUNITY): Payer: Medicare Other

## 2011-08-03 DIAGNOSIS — Z85820 Personal history of malignant melanoma of skin: Secondary | ICD-10-CM | POA: Diagnosis not present

## 2011-08-03 DIAGNOSIS — L570 Actinic keratosis: Secondary | ICD-10-CM | POA: Diagnosis not present

## 2011-08-03 DIAGNOSIS — D239 Other benign neoplasm of skin, unspecified: Secondary | ICD-10-CM | POA: Diagnosis not present

## 2011-08-03 DIAGNOSIS — Z85828 Personal history of other malignant neoplasm of skin: Secondary | ICD-10-CM | POA: Diagnosis not present

## 2011-08-05 ENCOUNTER — Encounter (HOSPITAL_COMMUNITY): Payer: Medicare Other

## 2011-08-05 ENCOUNTER — Encounter (HOSPITAL_COMMUNITY)
Admission: RE | Admit: 2011-08-05 | Discharge: 2011-08-05 | Disposition: A | Discharge status: Home / Self Care | Payer: Medicare Other | Source: Ambulatory Visit | Attending: Interventional Cardiology | Admitting: Interventional Cardiology

## 2011-08-05 ENCOUNTER — Ambulatory Visit (HOSPITAL_COMMUNITY): Payer: Medicare Other

## 2011-08-05 NOTE — Progress Notes (Signed)
Increased questionable PVC's noted today at cardiac rehab.  Patient asymptomatic vital signs stable.  ECG tracings faxed to Dr Hoyle Barr office for review.  Dr Mayford Knife reviewed ECG tracing's said the strips look like an abberancy. No new order received. Will continue to monitor.

## 2011-08-07 ENCOUNTER — Encounter (HOSPITAL_COMMUNITY): Payer: Medicare Other

## 2011-08-07 ENCOUNTER — Ambulatory Visit (HOSPITAL_COMMUNITY): Payer: Medicare Other

## 2011-08-07 ENCOUNTER — Encounter (HOSPITAL_COMMUNITY)
Admission: RE | Admit: 2011-08-07 | Discharge: 2011-08-07 | Disposition: A | Discharge status: Home / Self Care | Payer: Medicare Other | Source: Ambulatory Visit | Attending: Interventional Cardiology | Admitting: Interventional Cardiology

## 2011-08-07 DIAGNOSIS — I209 Angina pectoris, unspecified: Secondary | ICD-10-CM | POA: Insufficient documentation

## 2011-08-07 DIAGNOSIS — Z7982 Long term (current) use of aspirin: Secondary | ICD-10-CM | POA: Diagnosis not present

## 2011-08-07 DIAGNOSIS — E7800 Pure hypercholesterolemia, unspecified: Secondary | ICD-10-CM | POA: Insufficient documentation

## 2011-08-07 DIAGNOSIS — I2510 Atherosclerotic heart disease of native coronary artery without angina pectoris: Secondary | ICD-10-CM | POA: Insufficient documentation

## 2011-08-07 DIAGNOSIS — Z951 Presence of aortocoronary bypass graft: Secondary | ICD-10-CM | POA: Insufficient documentation

## 2011-08-07 DIAGNOSIS — F329 Major depressive disorder, single episode, unspecified: Secondary | ICD-10-CM | POA: Diagnosis not present

## 2011-08-07 DIAGNOSIS — Z5189 Encounter for other specified aftercare: Secondary | ICD-10-CM | POA: Diagnosis not present

## 2011-08-07 DIAGNOSIS — F3289 Other specified depressive episodes: Secondary | ICD-10-CM | POA: Insufficient documentation

## 2011-08-10 ENCOUNTER — Encounter (HOSPITAL_COMMUNITY): Payer: Medicare Other

## 2011-08-10 ENCOUNTER — Ambulatory Visit (HOSPITAL_COMMUNITY): Payer: Medicare Other

## 2011-08-10 ENCOUNTER — Encounter (HOSPITAL_COMMUNITY)
Admission: RE | Admit: 2011-08-10 | Discharge: 2011-08-10 | Disposition: A | Discharge status: Home / Self Care | Payer: Medicare Other | Source: Ambulatory Visit | Attending: Interventional Cardiology | Admitting: Interventional Cardiology

## 2011-08-12 ENCOUNTER — Encounter (HOSPITAL_COMMUNITY)
Admission: RE | Admit: 2011-08-12 | Discharge: 2011-08-12 | Disposition: A | Discharge status: Home / Self Care | Payer: Medicare Other | Source: Ambulatory Visit | Attending: Interventional Cardiology | Admitting: Interventional Cardiology

## 2011-08-12 ENCOUNTER — Encounter (HOSPITAL_COMMUNITY): Payer: Medicare Other

## 2011-08-12 ENCOUNTER — Ambulatory Visit (HOSPITAL_COMMUNITY): Payer: Medicare Other

## 2011-08-14 ENCOUNTER — Encounter (HOSPITAL_COMMUNITY)
Admission: RE | Admit: 2011-08-14 | Discharge: 2011-08-14 | Disposition: A | Discharge status: Home / Self Care | Payer: Medicare Other | Source: Ambulatory Visit | Attending: Interventional Cardiology | Admitting: Interventional Cardiology

## 2011-08-14 ENCOUNTER — Ambulatory Visit (HOSPITAL_COMMUNITY): Payer: Medicare Other

## 2011-08-14 ENCOUNTER — Encounter (HOSPITAL_COMMUNITY): Payer: Medicare Other

## 2011-08-17 ENCOUNTER — Encounter (HOSPITAL_COMMUNITY): Payer: Medicare Other

## 2011-08-17 ENCOUNTER — Ambulatory Visit (HOSPITAL_COMMUNITY): Payer: Medicare Other

## 2011-08-17 ENCOUNTER — Encounter (HOSPITAL_COMMUNITY)
Admission: RE | Admit: 2011-08-17 | Discharge: 2011-08-17 | Disposition: A | Discharge status: Home / Self Care | Payer: Medicare Other | Source: Ambulatory Visit | Attending: Interventional Cardiology | Admitting: Interventional Cardiology

## 2011-08-19 ENCOUNTER — Ambulatory Visit (HOSPITAL_COMMUNITY): Payer: Medicare Other

## 2011-08-19 ENCOUNTER — Encounter (HOSPITAL_COMMUNITY)
Admission: RE | Admit: 2011-08-19 | Discharge: 2011-08-19 | Disposition: A | Discharge status: Home / Self Care | Payer: Medicare Other | Source: Ambulatory Visit | Attending: Interventional Cardiology | Admitting: Interventional Cardiology

## 2011-08-19 ENCOUNTER — Encounter (HOSPITAL_COMMUNITY): Payer: Medicare Other

## 2011-08-19 NOTE — Progress Notes (Signed)
Charles Grant is going to [place] for 4 weeks.  Charles Grant plans to continue exercise at a cardiac rehab program in [place].

## 2011-08-20 NOTE — Progress Notes (Signed)
Cardiac Rehabilitation Program Progress Report   Orientation:  06/25/2011  Discharge Date:  08/19/2011  # of sessions completed: 16/36  Cardiologist: Eldridge Dace Family MD: none   Class Time:  0945  A.  Exercise Program:  Tolerates exercise @ 3.7 METS for 30 minutes, Bike Test Results:  Pre: 0.96 mile and Discharged to home exercise program.  Anticipated compliance:  Excellent, Pre Education Test 21/24  B.  Mental Health:  Good mental attitude and Quality of Life (QOL)  Pre Scores Only:  Overall  28.64, Health/Functioning 28.50, Socioeconomics 29.14, Psych/Spiritual 30, Family 26.40    C.  Education/Instruction/Skills  Accurately checks own pulse.  Rest:  71  Exercise:  116, Knows THR for exercise, Uses Perceived Exertion Scale and/or Dyspnea Scale and Attended 5/13 education classes  Home exercise given: 07/20/2011  D.  Nutrition/Weight Control/Body Composition:  Adherence to prescribed nutrition program: good  BMI 23.4  *This section completed by Mickle Plumb, Andres Shad, RD, LDN, CDE  E.  Blood Lipids    No results found for this basename: CHOL     No results found for this basename: TRIG     No results found for this basename: HDL     No results found for this basename: CHOLHDL     No results found for this basename: LDLDIRECT      F.  Lifestyle Changes:  Making positive lifestyle changes  G.  Symptoms noted with exercise:  Asymptomatic with a few couplets on 08/05/11  Report Completed By:  Hazle Nordmann   Comments:  Pt did fairly well in program progressing from 3.2 METs to 3.7 METs in about 5 weeks.  Pt was discharged as he is heading down to [place], [place].  Pt plans to continue Cardiac Rehab Phase II at [facility] while there.  Pt also plans to continue with his home exercise of walking and riding the stationary bike.  At d/c pt was in sinus rhythm.  Thanks for the referral. Fabio Pierce, MA, ACSM RCEP

## 2011-08-21 ENCOUNTER — Encounter (HOSPITAL_COMMUNITY): Payer: Medicare Other

## 2011-08-21 ENCOUNTER — Ambulatory Visit (HOSPITAL_COMMUNITY): Payer: Medicare Other

## 2011-08-24 ENCOUNTER — Encounter (HOSPITAL_COMMUNITY): Payer: Medicare Other

## 2011-08-24 ENCOUNTER — Ambulatory Visit (HOSPITAL_COMMUNITY): Payer: Medicare Other

## 2011-08-25 DIAGNOSIS — Z5189 Encounter for other specified aftercare: Secondary | ICD-10-CM | POA: Diagnosis not present

## 2011-08-25 DIAGNOSIS — Z951 Presence of aortocoronary bypass graft: Secondary | ICD-10-CM | POA: Diagnosis not present

## 2011-08-26 ENCOUNTER — Ambulatory Visit (HOSPITAL_COMMUNITY): Payer: Medicare Other

## 2011-08-26 ENCOUNTER — Encounter (HOSPITAL_COMMUNITY): Payer: Medicare Other

## 2011-08-26 DIAGNOSIS — Z951 Presence of aortocoronary bypass graft: Secondary | ICD-10-CM | POA: Diagnosis not present

## 2011-08-26 DIAGNOSIS — Z5189 Encounter for other specified aftercare: Secondary | ICD-10-CM | POA: Diagnosis not present

## 2011-08-28 ENCOUNTER — Ambulatory Visit (HOSPITAL_COMMUNITY): Payer: Medicare Other

## 2011-08-28 ENCOUNTER — Encounter (HOSPITAL_COMMUNITY): Payer: Medicare Other

## 2011-08-28 DIAGNOSIS — Z951 Presence of aortocoronary bypass graft: Secondary | ICD-10-CM | POA: Diagnosis not present

## 2011-08-28 DIAGNOSIS — Z5189 Encounter for other specified aftercare: Secondary | ICD-10-CM | POA: Diagnosis not present

## 2011-08-31 ENCOUNTER — Encounter (HOSPITAL_COMMUNITY): Payer: Medicare Other

## 2011-08-31 ENCOUNTER — Ambulatory Visit (HOSPITAL_COMMUNITY): Payer: Medicare Other

## 2011-08-31 DIAGNOSIS — Z5189 Encounter for other specified aftercare: Secondary | ICD-10-CM | POA: Diagnosis not present

## 2011-08-31 DIAGNOSIS — Z951 Presence of aortocoronary bypass graft: Secondary | ICD-10-CM | POA: Diagnosis not present

## 2011-09-02 ENCOUNTER — Encounter (HOSPITAL_COMMUNITY): Payer: Medicare Other

## 2011-09-02 ENCOUNTER — Ambulatory Visit (HOSPITAL_COMMUNITY): Payer: Medicare Other

## 2011-09-02 DIAGNOSIS — Z951 Presence of aortocoronary bypass graft: Secondary | ICD-10-CM | POA: Diagnosis not present

## 2011-09-02 DIAGNOSIS — Z5189 Encounter for other specified aftercare: Secondary | ICD-10-CM | POA: Diagnosis not present

## 2011-09-04 ENCOUNTER — Encounter (HOSPITAL_COMMUNITY): Payer: Medicare Other

## 2011-09-04 ENCOUNTER — Ambulatory Visit (HOSPITAL_COMMUNITY): Payer: Medicare Other

## 2011-09-04 DIAGNOSIS — Z5189 Encounter for other specified aftercare: Secondary | ICD-10-CM | POA: Diagnosis not present

## 2011-09-04 DIAGNOSIS — Z951 Presence of aortocoronary bypass graft: Secondary | ICD-10-CM | POA: Diagnosis not present

## 2011-09-07 ENCOUNTER — Encounter (HOSPITAL_COMMUNITY): Payer: Medicare Other

## 2011-09-07 ENCOUNTER — Ambulatory Visit (HOSPITAL_COMMUNITY): Payer: Medicare Other

## 2011-09-07 DIAGNOSIS — Z951 Presence of aortocoronary bypass graft: Secondary | ICD-10-CM | POA: Diagnosis not present

## 2011-09-07 DIAGNOSIS — Z5189 Encounter for other specified aftercare: Secondary | ICD-10-CM | POA: Diagnosis not present

## 2011-09-09 ENCOUNTER — Encounter (HOSPITAL_COMMUNITY): Payer: Medicare Other

## 2011-09-09 ENCOUNTER — Ambulatory Visit (HOSPITAL_COMMUNITY): Payer: Medicare Other

## 2011-09-09 DIAGNOSIS — Z951 Presence of aortocoronary bypass graft: Secondary | ICD-10-CM | POA: Diagnosis not present

## 2011-09-09 DIAGNOSIS — Z5189 Encounter for other specified aftercare: Secondary | ICD-10-CM | POA: Diagnosis not present

## 2011-09-11 ENCOUNTER — Encounter (HOSPITAL_COMMUNITY): Payer: Medicare Other

## 2011-09-11 ENCOUNTER — Ambulatory Visit (HOSPITAL_COMMUNITY): Payer: Medicare Other

## 2011-09-11 DIAGNOSIS — Z5189 Encounter for other specified aftercare: Secondary | ICD-10-CM | POA: Diagnosis not present

## 2011-09-11 DIAGNOSIS — Z951 Presence of aortocoronary bypass graft: Secondary | ICD-10-CM | POA: Diagnosis not present

## 2011-09-14 ENCOUNTER — Encounter (HOSPITAL_COMMUNITY): Payer: Medicare Other

## 2011-09-14 ENCOUNTER — Ambulatory Visit (HOSPITAL_COMMUNITY): Payer: Medicare Other

## 2011-09-15 DIAGNOSIS — C44621 Squamous cell carcinoma of skin of unspecified upper limb, including shoulder: Secondary | ICD-10-CM | POA: Diagnosis not present

## 2011-09-15 DIAGNOSIS — L03319 Cellulitis of trunk, unspecified: Secondary | ICD-10-CM | POA: Diagnosis not present

## 2011-09-15 DIAGNOSIS — D235 Other benign neoplasm of skin of trunk: Secondary | ICD-10-CM | POA: Diagnosis not present

## 2011-09-15 DIAGNOSIS — L02219 Cutaneous abscess of trunk, unspecified: Secondary | ICD-10-CM | POA: Diagnosis not present

## 2011-09-15 DIAGNOSIS — D0460 Carcinoma in situ of skin of unspecified upper limb, including shoulder: Secondary | ICD-10-CM | POA: Diagnosis not present

## 2011-09-15 DIAGNOSIS — C44320 Squamous cell carcinoma of skin of unspecified parts of face: Secondary | ICD-10-CM | POA: Diagnosis not present

## 2011-09-15 DIAGNOSIS — L820 Inflamed seborrheic keratosis: Secondary | ICD-10-CM | POA: Diagnosis not present

## 2011-09-16 ENCOUNTER — Encounter (HOSPITAL_COMMUNITY): Payer: Medicare Other

## 2011-09-16 ENCOUNTER — Ambulatory Visit (HOSPITAL_COMMUNITY): Payer: Medicare Other

## 2011-09-16 DIAGNOSIS — Z951 Presence of aortocoronary bypass graft: Secondary | ICD-10-CM | POA: Diagnosis not present

## 2011-09-16 DIAGNOSIS — Z5189 Encounter for other specified aftercare: Secondary | ICD-10-CM | POA: Diagnosis not present

## 2011-09-17 NOTE — Progress Notes (Signed)
Agree with progress report from cardiac rehab on 08/20/11 written by Trevor Mace and and Heloise Purpura.

## 2011-09-18 ENCOUNTER — Ambulatory Visit (HOSPITAL_COMMUNITY): Payer: Medicare Other

## 2011-09-18 ENCOUNTER — Encounter (HOSPITAL_COMMUNITY): Payer: Medicare Other

## 2011-09-18 DIAGNOSIS — Z5189 Encounter for other specified aftercare: Secondary | ICD-10-CM | POA: Diagnosis not present

## 2011-09-18 DIAGNOSIS — Z951 Presence of aortocoronary bypass graft: Secondary | ICD-10-CM | POA: Diagnosis not present

## 2011-09-21 ENCOUNTER — Ambulatory Visit (HOSPITAL_COMMUNITY): Payer: Medicare Other

## 2011-09-21 ENCOUNTER — Encounter (HOSPITAL_COMMUNITY): Payer: Medicare Other

## 2011-09-21 DIAGNOSIS — Z5189 Encounter for other specified aftercare: Secondary | ICD-10-CM | POA: Diagnosis not present

## 2011-09-21 DIAGNOSIS — Z951 Presence of aortocoronary bypass graft: Secondary | ICD-10-CM | POA: Diagnosis not present

## 2011-09-23 ENCOUNTER — Encounter (HOSPITAL_COMMUNITY): Payer: Medicare Other

## 2011-09-23 ENCOUNTER — Ambulatory Visit (HOSPITAL_COMMUNITY): Payer: Medicare Other

## 2011-09-23 DIAGNOSIS — Z951 Presence of aortocoronary bypass graft: Secondary | ICD-10-CM | POA: Diagnosis not present

## 2011-09-23 DIAGNOSIS — Z5189 Encounter for other specified aftercare: Secondary | ICD-10-CM | POA: Diagnosis not present

## 2011-09-25 ENCOUNTER — Encounter (HOSPITAL_COMMUNITY): Payer: Medicare Other

## 2011-09-25 ENCOUNTER — Ambulatory Visit (HOSPITAL_COMMUNITY): Payer: Medicare Other

## 2011-09-25 DIAGNOSIS — Z5189 Encounter for other specified aftercare: Secondary | ICD-10-CM | POA: Diagnosis not present

## 2011-09-25 DIAGNOSIS — Z951 Presence of aortocoronary bypass graft: Secondary | ICD-10-CM | POA: Diagnosis not present

## 2011-09-28 ENCOUNTER — Encounter (HOSPITAL_COMMUNITY): Payer: Medicare Other

## 2011-09-28 DIAGNOSIS — Z951 Presence of aortocoronary bypass graft: Secondary | ICD-10-CM | POA: Diagnosis not present

## 2011-09-28 DIAGNOSIS — Z5189 Encounter for other specified aftercare: Secondary | ICD-10-CM | POA: Diagnosis not present

## 2011-09-29 DIAGNOSIS — L821 Other seborrheic keratosis: Secondary | ICD-10-CM | POA: Diagnosis not present

## 2011-09-29 DIAGNOSIS — L570 Actinic keratosis: Secondary | ICD-10-CM | POA: Diagnosis not present

## 2011-09-30 ENCOUNTER — Encounter (HOSPITAL_COMMUNITY): Payer: Medicare Other

## 2011-09-30 DIAGNOSIS — Z5189 Encounter for other specified aftercare: Secondary | ICD-10-CM | POA: Diagnosis not present

## 2011-09-30 DIAGNOSIS — Z951 Presence of aortocoronary bypass graft: Secondary | ICD-10-CM | POA: Diagnosis not present

## 2011-10-02 ENCOUNTER — Encounter (HOSPITAL_COMMUNITY): Payer: Medicare Other

## 2011-10-02 DIAGNOSIS — J301 Allergic rhinitis due to pollen: Secondary | ICD-10-CM | POA: Diagnosis not present

## 2011-10-05 ENCOUNTER — Encounter (HOSPITAL_COMMUNITY): Payer: Medicare Other

## 2011-10-05 DIAGNOSIS — I4460 Unspecified fascicular block: Secondary | ICD-10-CM | POA: Diagnosis not present

## 2011-10-05 DIAGNOSIS — I2510 Atherosclerotic heart disease of native coronary artery without angina pectoris: Secondary | ICD-10-CM | POA: Diagnosis not present

## 2011-10-05 DIAGNOSIS — I498 Other specified cardiac arrhythmias: Secondary | ICD-10-CM | POA: Diagnosis not present

## 2011-10-05 DIAGNOSIS — H269 Unspecified cataract: Secondary | ICD-10-CM | POA: Diagnosis not present

## 2011-10-07 ENCOUNTER — Encounter (HOSPITAL_COMMUNITY): Payer: Medicare Other

## 2011-10-09 ENCOUNTER — Encounter (HOSPITAL_COMMUNITY): Payer: Medicare Other

## 2011-10-12 ENCOUNTER — Encounter (HOSPITAL_COMMUNITY): Payer: Medicare Other

## 2011-10-14 ENCOUNTER — Encounter (HOSPITAL_COMMUNITY): Payer: Medicare Other

## 2011-10-14 DIAGNOSIS — D649 Anemia, unspecified: Secondary | ICD-10-CM | POA: Diagnosis not present

## 2011-10-14 DIAGNOSIS — E7800 Pure hypercholesterolemia, unspecified: Secondary | ICD-10-CM | POA: Diagnosis not present

## 2011-10-14 DIAGNOSIS — I2510 Atherosclerotic heart disease of native coronary artery without angina pectoris: Secondary | ICD-10-CM | POA: Diagnosis not present

## 2011-10-15 DIAGNOSIS — I2510 Atherosclerotic heart disease of native coronary artery without angina pectoris: Secondary | ICD-10-CM | POA: Diagnosis not present

## 2011-10-15 DIAGNOSIS — D649 Anemia, unspecified: Secondary | ICD-10-CM | POA: Diagnosis not present

## 2011-10-15 DIAGNOSIS — E7800 Pure hypercholesterolemia, unspecified: Secondary | ICD-10-CM | POA: Diagnosis not present

## 2011-10-16 ENCOUNTER — Encounter (HOSPITAL_COMMUNITY): Payer: Medicare Other

## 2011-11-03 DIAGNOSIS — H2510 Age-related nuclear cataract, unspecified eye: Secondary | ICD-10-CM | POA: Diagnosis not present

## 2011-11-03 DIAGNOSIS — I2510 Atherosclerotic heart disease of native coronary artery without angina pectoris: Secondary | ICD-10-CM | POA: Diagnosis not present

## 2011-11-03 DIAGNOSIS — H269 Unspecified cataract: Secondary | ICD-10-CM | POA: Diagnosis not present

## 2011-11-04 DIAGNOSIS — H2510 Age-related nuclear cataract, unspecified eye: Secondary | ICD-10-CM | POA: Diagnosis not present

## 2011-11-05 ENCOUNTER — Other Ambulatory Visit: Payer: Self-pay | Admitting: Dermatology

## 2011-11-05 DIAGNOSIS — C44211 Basal cell carcinoma of skin of unspecified ear and external auricular canal: Secondary | ICD-10-CM | POA: Diagnosis not present

## 2011-11-05 DIAGNOSIS — Z85820 Personal history of malignant melanoma of skin: Secondary | ICD-10-CM | POA: Diagnosis not present

## 2011-11-05 DIAGNOSIS — L570 Actinic keratosis: Secondary | ICD-10-CM | POA: Diagnosis not present

## 2011-11-30 ENCOUNTER — Encounter (HOSPITAL_COMMUNITY): Payer: Self-pay | Admitting: Cardiology

## 2011-11-30 ENCOUNTER — Other Ambulatory Visit: Payer: Self-pay

## 2011-11-30 ENCOUNTER — Emergency Department (HOSPITAL_COMMUNITY)
Admission: EM | Admit: 2011-11-30 | Discharge: 2011-11-30 | Disposition: A | Discharge status: Home / Self Care | Payer: Medicare Other | Attending: Emergency Medicine | Admitting: Emergency Medicine

## 2011-11-30 ENCOUNTER — Emergency Department (HOSPITAL_COMMUNITY): Payer: Medicare Other

## 2011-11-30 DIAGNOSIS — I25810 Atherosclerosis of coronary artery bypass graft(s) without angina pectoris: Secondary | ICD-10-CM | POA: Insufficient documentation

## 2011-11-30 DIAGNOSIS — R55 Syncope and collapse: Secondary | ICD-10-CM | POA: Insufficient documentation

## 2011-11-30 DIAGNOSIS — R5381 Other malaise: Secondary | ICD-10-CM | POA: Insufficient documentation

## 2011-11-30 DIAGNOSIS — R61 Generalized hyperhidrosis: Secondary | ICD-10-CM | POA: Diagnosis not present

## 2011-11-30 DIAGNOSIS — R112 Nausea with vomiting, unspecified: Secondary | ICD-10-CM | POA: Diagnosis not present

## 2011-11-30 DIAGNOSIS — R42 Dizziness and giddiness: Secondary | ICD-10-CM | POA: Diagnosis not present

## 2011-11-30 DIAGNOSIS — R5383 Other fatigue: Secondary | ICD-10-CM | POA: Diagnosis not present

## 2011-11-30 LAB — BASIC METABOLIC PANEL
Calcium: 8.6 mg/dL (ref 8.4–10.5)
Creatinine, Ser: 0.88 mg/dL (ref 0.50–1.35)
GFR calc Af Amer: >90 mL/min (ref >90)

## 2011-11-30 LAB — URINALYSIS, ROUTINE W REFLEX MICROSCOPIC
Glucose, UA: NEGATIVE mg/dL
Ketones, ur: NEGATIVE mg/dL
Leukocytes, UA: NEGATIVE
Nitrite: NEGATIVE
Protein, ur: NEGATIVE mg/dL

## 2011-11-30 LAB — DIFFERENTIAL
Basophils Absolute: 0 10*3/uL (ref 0.0–0.1)
Basophils Relative: 0 % (ref 0–1)
Eosinophils Absolute: 0.1 10*3/uL (ref 0.0–0.7)
Monocytes Absolute: 0.5 10*3/uL (ref 0.1–1.0)
Neutro Abs: 3.7 10*3/uL (ref 1.7–7.7)

## 2011-11-30 LAB — CBC
HCT: 30 % — ABNORMAL LOW (ref 39.0–52.0)
MCHC: 35 g/dL (ref 30.0–36.0)
RDW: 12.7 % (ref 11.5–15.5)

## 2011-11-30 LAB — POCT I-STAT TROPONIN I

## 2011-11-30 MED ORDER — SODIUM CHLORIDE 0.9 % IV BOLUS (SEPSIS): 500.0000 mL | Freq: Once | INTRAVENOUS | Status: DC

## 2011-11-30 NOTE — ED Notes (Signed)
Pt states that he was playing golf and after lunch he started playing golf and started feeling dizzy and unsteady, pt states he started getting nauseated and vomited one time and laid down. Pt denies pain or dizzy at this time.

## 2011-11-30 NOTE — ED Provider Notes (Addendum)
History     CSN: [id number]  Arrival date & time 11/30/11  1440   First MD Initiated Contact with Patient 11/30/11 1508      Chief Complaint  Patient presents with  . Near Syncope    (Consider location/radiation/quality/duration/timing/severity/associated sxs/prior treatment) HPI Pt reports he was [place] the golf course this afternoon hitting balls on the driving range when he began to feel lightheaded and dizzy like he might pass out. He got weak and sat down and they lay down on the ground. He did not have any LOC, did not fall. No CP, SOB. He had an episode of nausea and vomited once per EMS. He is feeling better now but does report feeling weak when he stood up to get on the EMS stretcher. Had a 5 vessel CABG about 7 months ago.   Past Medical History  Diagnosis Date  . Angina   . Heart murmur   . Depression   . Anemia   . Coronary artery disease 05/21/2011    Past Surgical History  Procedure Date  . Tonsillectomy   . Cardiac catheterization   . Coronary artery bypass graft 05/22/2011    Procedure: CORONARY ARTERY BYPASS GRAFTING (CABG);  Surgeon: Delight Ovens, MD;  Location: [facility] OR;  Service: Open Heart Surgery;  Laterality: N/A;  CABG x five;  using left internal mammary artery and right leg greater saphenous vein harvested endoscopically    History reviewed. No pertinent family history.  History  Substance Use Topics  . Smoking status: Never Smoker   . Smokeless tobacco: Never Used  . Alcohol Use: 6.0 oz/week    10 Glasses of wine per week      Review of Systems All other systems reviewed and are negative except as noted in HPI.   Allergies  Penicillins and Montelukast sodium  Home Medications   Current Outpatient Rx  Name Route Sig Dispense Refill  . ASPIRIN EC 81 MG PO TBEC Oral Take 81 mg by mouth daily.      Marland Kitchen CINNAMON 500 MG PO CAPS Oral Take 250 mg by mouth daily.    Marland Kitchen FLUOXETINE HCL 20 MG PO CAPS Oral Take 60 mg by mouth daily.    Marland Kitchen FOLIC ACID  1 MG PO TABS Oral Take 1 mg by mouth daily.      Marland Kitchen ROSUVASTATIN CALCIUM 10 MG PO TABS Oral Take 10 mg by mouth every evening.    Marland Kitchen SILDENAFIL CITRATE 100 MG PO TABS Oral Take 100 mg by mouth daily as needed. For E.D.    . TAMSULOSIN HCL 0.4 MG PO CAPS Oral Take 0.8 mg by mouth daily after supper.      BP 117/68  Pulse 62  Temp 98.4 F (36.9 C) (Oral)  Resp 18  SpO2 98%  Physical Exam  Nursing note and vitals reviewed. Constitutional: He is oriented to person, place, and time. He appears well-developed and well-nourished.  HENT:  Head: Normocephalic and atraumatic.  Eyes: EOM are normal. Pupils are equal, round, and reactive to light.  Neck: Normal range of motion. Neck supple.  Cardiovascular: Normal rate, normal heart sounds and intact distal pulses.   Pulmonary/Chest: Effort normal and breath sounds normal.  Abdominal: Bowel sounds are normal. He exhibits no distension. There is no tenderness.  Musculoskeletal: Normal range of motion. He exhibits no edema and no tenderness.  Neurological: He is alert and oriented to person, place, and time. He has normal strength. No cranial nerve deficit or sensory deficit.  Skin: Skin is warm and dry. No rash noted.  Psychiatric: He has a normal mood and affect.    ED Course  Procedures (including critical care time)  Labs Reviewed  BASIC METABOLIC PANEL - Abnormal; Notable for the following:    Sodium 131 (*)     GFR calc non Af Amer 81 (*)     All other components within normal limits  CBC - Abnormal; Notable for the following:    RBC 3.27 (*)     Hemoglobin 10.5 (*)     HCT 30.0 (*)     Platelets 129 (*)     All other components within normal limits  URINALYSIS, ROUTINE W REFLEX MICROSCOPIC  DIFFERENTIAL  POCT I-STAT TROPONIN I   Dg Chest 2 View  11/30/2011  *RADIOLOGY REPORT*  Clinical Data: Nausea, vomiting, dizziness, weakness  CHEST - 2 VIEW  Comparison: 06/23/2011  Findings: Upper normal-sized cardiac silhouette post CABG.  Tortuous aorta. Pulmonary vascularity normal. Lungs slightly hyperaerated but clear. No infiltrate, pleural effusion or pneumothorax. Numerous cardiac monitoring leads project over chest.  IMPRESSION: No acute abnormalities.  Original Report Authenticated By: Lollie Marrow, M.D.     No diagnosis found.    MDM   Date: 11/30/2011  Rate: 66  Rhythm: normal sinus rhythm  QRS Axis: normal  Intervals: normal  ST/T Wave abnormalities: normal  Conduction Disutrbances:nonspecific intraventricular conduction delay  Narrative Interpretation:   Old EKG Reviewed: unchanged  6:12 PM Pt resting comfortably. Has been asymptomatic since arrival including orthostatics and standing for CXR. Near syncope without clear reason, but did not have any chest pain, SOB or loss of consciousness. Patient would like to go home. Advised close PCP followup. Discussed reasons for returning and patient understands to call EMS or have someone bring him back for any return of symptoms, loss of consciousness, chest pain, SOB or for any other concerns.          Juliannah Ohmann B. Bernette Mayers, MD 11/30/11 1813  Bonnita Levan. Bernette Mayers, MD 11/30/11 1610

## 2011-11-30 NOTE — Discharge Instructions (Signed)
Near-Syncope Near-syncope is sudden weakness, dizziness, or feeling like you might pass out (faint). This may occur when getting up after sitting or while standing for a long period of time. Near-syncope can be caused by a drop in blood pressure. This is a common reaction, but it may occur to a greater degree in people taking medicines to control their blood pressure. Fainting often occurs when the blood pressure or pulse is too low to provide enough blood flow to the brain to keep you conscious. Fainting and near-syncope are not usually due to serious medical problems. However, certain people should be more cautious in the event of near-syncope, including elderly patients, patients with diabetes, and patients with a history of heart conditions (especially irregular rhythms).  CAUSES   Drop in blood pressure.   Physical pain.   Dehydration.   Heat exhaustion.   Emotional distress.   Low blood sugar.   Internal bleeding.   Heart and circulatory problems.   Infections.  SYMPTOMS   Dizziness.   Feeling sick to your stomach (nauseous).   Nearly fainting.   Body numbness.   Turning pale.   Tunnel vision.   Weakness.  HOME CARE INSTRUCTIONS   Lie down right away if you start feeling like you might faint. Breathe deeply and steadily. Wait until all the symptoms have passed. Most of these episodes last only a few minutes. You may feel tired for several hours.   Drink enough fluids to keep your urine clear or pale yellow.   If you are taking blood pressure or heart medicine, get up slowly, taking several minutes to sit and then stand. This can reduce dizziness that is caused by a drop in blood pressure.  SEEK IMMEDIATE MEDICAL CARE IF:   You have a severe headache.   Unusual pain develops in the chest, abdomen, or back.   There is bleeding from the mouth or rectum, or you have black or tarry stool.   An irregular heartbeat or a very rapid pulse develops.   You have  repeated fainting or seizure-like jerking during an episode.   You faint when sitting or lying down.   You develop confusion.   You have difficulty walking.   Severe weakness develops.   Vision problems develop.  MAKE SURE YOU:   Understand these instructions.   Will watch your condition.   Will get help right away if you are not doing well or get worse.  Document Released: 05/25/2005 Document Revised: 05/14/2011 Document Reviewed: 07/11/2010 Exit[employer] Patient Information 2012 [employer]. 

## 2011-11-30 NOTE — ED Notes (Signed)
Pt to department via EMS- pt reports that he was out on the golf course today and had an episode where he felt weak and nauseaed. Pt did vomit prior to EMS arrival. Pt A&Ox4. Skin now warm and dry. BP- 143/83 HR-65. Recent CABG by cardiology. CBG-100. 18 Left hand.

## 2011-12-02 DIAGNOSIS — F3342 Major depressive disorder, recurrent, in full remission: Secondary | ICD-10-CM | POA: Diagnosis not present

## 2011-12-03 DIAGNOSIS — C44211 Basal cell carcinoma of skin of unspecified ear and external auricular canal: Secondary | ICD-10-CM | POA: Diagnosis not present

## 2011-12-07 DIAGNOSIS — R5383 Other fatigue: Secondary | ICD-10-CM | POA: Diagnosis not present

## 2011-12-07 DIAGNOSIS — D539 Nutritional anemia, unspecified: Secondary | ICD-10-CM | POA: Diagnosis not present

## 2011-12-07 DIAGNOSIS — R42 Dizziness and giddiness: Secondary | ICD-10-CM | POA: Diagnosis not present

## 2011-12-07 DIAGNOSIS — R5381 Other malaise: Secondary | ICD-10-CM | POA: Diagnosis not present

## 2011-12-07 DIAGNOSIS — D649 Anemia, unspecified: Secondary | ICD-10-CM | POA: Diagnosis not present

## 2011-12-09 DIAGNOSIS — D649 Anemia, unspecified: Secondary | ICD-10-CM | POA: Diagnosis not present

## 2011-12-11 DIAGNOSIS — D649 Anemia, unspecified: Secondary | ICD-10-CM | POA: Diagnosis not present

## 2011-12-18 DIAGNOSIS — D649 Anemia, unspecified: Secondary | ICD-10-CM | POA: Diagnosis not present

## 2011-12-24 DIAGNOSIS — D649 Anemia, unspecified: Secondary | ICD-10-CM | POA: Diagnosis not present

## 2011-12-31 DIAGNOSIS — D649 Anemia, unspecified: Secondary | ICD-10-CM | POA: Diagnosis not present

## 2012-01-18 DIAGNOSIS — H269 Unspecified cataract: Secondary | ICD-10-CM | POA: Diagnosis not present

## 2012-01-18 DIAGNOSIS — H2510 Age-related nuclear cataract, unspecified eye: Secondary | ICD-10-CM | POA: Diagnosis not present

## 2012-01-19 DIAGNOSIS — F331 Major depressive disorder, recurrent, moderate: Secondary | ICD-10-CM | POA: Diagnosis not present

## 2012-01-19 DIAGNOSIS — D510 Vitamin B12 deficiency anemia due to intrinsic factor deficiency: Secondary | ICD-10-CM | POA: Diagnosis not present

## 2012-01-28 DIAGNOSIS — I2510 Atherosclerotic heart disease of native coronary artery without angina pectoris: Secondary | ICD-10-CM | POA: Diagnosis not present

## 2012-01-28 DIAGNOSIS — H2510 Age-related nuclear cataract, unspecified eye: Secondary | ICD-10-CM | POA: Diagnosis not present

## 2012-01-28 DIAGNOSIS — Z9109 Other allergy status, other than to drugs and biological substances: Secondary | ICD-10-CM | POA: Diagnosis not present

## 2012-01-28 DIAGNOSIS — Z7982 Long term (current) use of aspirin: Secondary | ICD-10-CM | POA: Diagnosis not present

## 2012-01-28 DIAGNOSIS — Z79899 Other long term (current) drug therapy: Secondary | ICD-10-CM | POA: Diagnosis not present

## 2012-01-28 DIAGNOSIS — Z880 Allergy status to penicillin: Secondary | ICD-10-CM | POA: Diagnosis not present

## 2012-01-28 DIAGNOSIS — H2589 Other age-related cataract: Secondary | ICD-10-CM | POA: Diagnosis not present

## 2012-02-01 DIAGNOSIS — F3342 Major depressive disorder, recurrent, in full remission: Secondary | ICD-10-CM | POA: Diagnosis not present

## 2012-02-16 DIAGNOSIS — F331 Major depressive disorder, recurrent, moderate: Secondary | ICD-10-CM | POA: Diagnosis not present

## 2012-03-15 DIAGNOSIS — Z79899 Other long term (current) drug therapy: Secondary | ICD-10-CM | POA: Diagnosis not present

## 2012-03-15 DIAGNOSIS — E7800 Pure hypercholesterolemia, unspecified: Secondary | ICD-10-CM | POA: Diagnosis not present

## 2012-03-15 DIAGNOSIS — D510 Vitamin B12 deficiency anemia due to intrinsic factor deficiency: Secondary | ICD-10-CM | POA: Diagnosis not present

## 2012-04-27 DIAGNOSIS — I2510 Atherosclerotic heart disease of native coronary artery without angina pectoris: Secondary | ICD-10-CM | POA: Diagnosis not present

## 2012-04-27 DIAGNOSIS — E7800 Pure hypercholesterolemia, unspecified: Secondary | ICD-10-CM | POA: Diagnosis not present

## 2012-04-27 DIAGNOSIS — D649 Anemia, unspecified: Secondary | ICD-10-CM | POA: Diagnosis not present

## 2012-04-29 DIAGNOSIS — Z23 Encounter for immunization: Secondary | ICD-10-CM | POA: Diagnosis not present

## 2012-04-29 DIAGNOSIS — E7800 Pure hypercholesterolemia, unspecified: Secondary | ICD-10-CM | POA: Diagnosis not present

## 2012-04-29 DIAGNOSIS — D510 Vitamin B12 deficiency anemia due to intrinsic factor deficiency: Secondary | ICD-10-CM | POA: Diagnosis not present

## 2012-04-29 DIAGNOSIS — Z0000 Encounter for general adult medical examination without abnormal findings: Secondary | ICD-10-CM | POA: Diagnosis not present

## 2012-05-06 DIAGNOSIS — J209 Acute bronchitis, unspecified: Secondary | ICD-10-CM | POA: Diagnosis not present

## 2012-05-11 DIAGNOSIS — E871 Hypo-osmolality and hyponatremia: Secondary | ICD-10-CM | POA: Diagnosis not present

## 2012-05-11 DIAGNOSIS — R05 Cough: Secondary | ICD-10-CM | POA: Diagnosis not present

## 2012-05-11 DIAGNOSIS — R059 Cough, unspecified: Secondary | ICD-10-CM | POA: Diagnosis not present

## 2012-05-30 DIAGNOSIS — D649 Anemia, unspecified: Secondary | ICD-10-CM | POA: Diagnosis not present

## 2012-06-21 DIAGNOSIS — E871 Hypo-osmolality and hyponatremia: Secondary | ICD-10-CM | POA: Diagnosis not present

## 2012-06-21 DIAGNOSIS — R059 Cough, unspecified: Secondary | ICD-10-CM | POA: Diagnosis not present

## 2012-06-21 DIAGNOSIS — R05 Cough: Secondary | ICD-10-CM | POA: Diagnosis not present

## 2012-06-21 DIAGNOSIS — R5381 Other malaise: Secondary | ICD-10-CM | POA: Diagnosis not present

## 2012-06-21 DIAGNOSIS — R5383 Other fatigue: Secondary | ICD-10-CM | POA: Diagnosis not present

## 2012-06-24 DIAGNOSIS — E538 Deficiency of other specified B group vitamins: Secondary | ICD-10-CM | POA: Diagnosis not present

## 2012-06-24 DIAGNOSIS — D649 Anemia, unspecified: Secondary | ICD-10-CM | POA: Diagnosis not present

## 2012-06-24 DIAGNOSIS — R5381 Other malaise: Secondary | ICD-10-CM | POA: Diagnosis not present

## 2012-06-24 DIAGNOSIS — E871 Hypo-osmolality and hyponatremia: Secondary | ICD-10-CM | POA: Diagnosis not present

## 2012-06-24 DIAGNOSIS — M949 Disorder of cartilage, unspecified: Secondary | ICD-10-CM | POA: Diagnosis not present

## 2012-06-24 DIAGNOSIS — E559 Vitamin D deficiency, unspecified: Secondary | ICD-10-CM | POA: Diagnosis not present

## 2012-06-24 DIAGNOSIS — M899 Disorder of bone, unspecified: Secondary | ICD-10-CM | POA: Diagnosis not present

## 2012-06-24 DIAGNOSIS — E7800 Pure hypercholesterolemia, unspecified: Secondary | ICD-10-CM | POA: Diagnosis not present

## 2012-07-04 DIAGNOSIS — E559 Vitamin D deficiency, unspecified: Secondary | ICD-10-CM | POA: Diagnosis not present

## 2012-07-04 DIAGNOSIS — R5381 Other malaise: Secondary | ICD-10-CM | POA: Diagnosis not present

## 2012-07-04 DIAGNOSIS — M899 Disorder of bone, unspecified: Secondary | ICD-10-CM | POA: Diagnosis not present

## 2012-07-04 DIAGNOSIS — M949 Disorder of cartilage, unspecified: Secondary | ICD-10-CM | POA: Diagnosis not present

## 2012-07-05 DIAGNOSIS — D510 Vitamin B12 deficiency anemia due to intrinsic factor deficiency: Secondary | ICD-10-CM | POA: Diagnosis not present

## 2012-07-07 DIAGNOSIS — E236 Other disorders of pituitary gland: Secondary | ICD-10-CM | POA: Diagnosis not present

## 2012-07-15 ENCOUNTER — Other Ambulatory Visit: Payer: Self-pay | Admitting: Internal Medicine

## 2012-07-15 DIAGNOSIS — R410 Disorientation, unspecified: Secondary | ICD-10-CM

## 2012-07-19 ENCOUNTER — Ambulatory Visit
Admission: RE | Admit: 2012-07-19 | Discharge: 2012-07-19 | Disposition: A | Discharge status: Home / Self Care | Payer: Medicare Other | Source: Ambulatory Visit | Attending: Internal Medicine | Admitting: Internal Medicine

## 2012-07-19 DIAGNOSIS — R404 Transient alteration of awareness: Secondary | ICD-10-CM | POA: Diagnosis not present

## 2012-07-19 DIAGNOSIS — R410 Disorientation, unspecified: Secondary | ICD-10-CM

## 2012-07-19 MED ORDER — GADOBENATE DIMEGLUMINE 529 MG/ML IV SOLN
14.0000 mL | Freq: Once | INTRAVENOUS | Status: AC | PRN
  Administered 2012-07-19: 14 mL via INTRAVENOUS

## 2012-07-20 ENCOUNTER — Other Ambulatory Visit: Payer: Medicare Other

## 2012-08-02 DIAGNOSIS — H1045 Other chronic allergic conjunctivitis: Secondary | ICD-10-CM | POA: Diagnosis not present

## 2012-08-15 DIAGNOSIS — D510 Vitamin B12 deficiency anemia due to intrinsic factor deficiency: Secondary | ICD-10-CM | POA: Diagnosis not present

## 2012-09-30 DIAGNOSIS — D510 Vitamin B12 deficiency anemia due to intrinsic factor deficiency: Secondary | ICD-10-CM | POA: Diagnosis not present

## 2012-09-30 DIAGNOSIS — J309 Allergic rhinitis, unspecified: Secondary | ICD-10-CM | POA: Diagnosis not present

## 2012-10-10 DIAGNOSIS — E7800 Pure hypercholesterolemia, unspecified: Secondary | ICD-10-CM | POA: Diagnosis not present

## 2012-10-10 DIAGNOSIS — R5381 Other malaise: Secondary | ICD-10-CM | POA: Diagnosis not present

## 2012-10-10 DIAGNOSIS — E559 Vitamin D deficiency, unspecified: Secondary | ICD-10-CM | POA: Diagnosis not present

## 2012-10-10 DIAGNOSIS — R5383 Other fatigue: Secondary | ICD-10-CM | POA: Diagnosis not present

## 2012-10-10 DIAGNOSIS — Z79899 Other long term (current) drug therapy: Secondary | ICD-10-CM | POA: Diagnosis not present

## 2012-10-13 DIAGNOSIS — L821 Other seborrheic keratosis: Secondary | ICD-10-CM | POA: Diagnosis not present

## 2012-10-13 DIAGNOSIS — L570 Actinic keratosis: Secondary | ICD-10-CM | POA: Diagnosis not present

## 2012-10-13 DIAGNOSIS — Z85820 Personal history of malignant melanoma of skin: Secondary | ICD-10-CM | POA: Diagnosis not present

## 2012-10-13 DIAGNOSIS — Z85828 Personal history of other malignant neoplasm of skin: Secondary | ICD-10-CM | POA: Diagnosis not present

## 2012-10-13 DIAGNOSIS — D1801 Hemangioma of skin and subcutaneous tissue: Secondary | ICD-10-CM | POA: Diagnosis not present

## 2012-10-13 DIAGNOSIS — L819 Disorder of pigmentation, unspecified: Secondary | ICD-10-CM | POA: Diagnosis not present

## 2012-10-26 DIAGNOSIS — I2510 Atherosclerotic heart disease of native coronary artery without angina pectoris: Secondary | ICD-10-CM | POA: Diagnosis not present

## 2012-10-26 DIAGNOSIS — D649 Anemia, unspecified: Secondary | ICD-10-CM | POA: Diagnosis not present

## 2012-10-26 DIAGNOSIS — R5383 Other fatigue: Secondary | ICD-10-CM | POA: Diagnosis not present

## 2012-10-26 DIAGNOSIS — E7800 Pure hypercholesterolemia, unspecified: Secondary | ICD-10-CM | POA: Diagnosis not present

## 2012-10-26 DIAGNOSIS — R5381 Other malaise: Secondary | ICD-10-CM | POA: Diagnosis not present

## 2012-11-02 DIAGNOSIS — D510 Vitamin B12 deficiency anemia due to intrinsic factor deficiency: Secondary | ICD-10-CM | POA: Diagnosis not present

## 2012-11-04 IMAGING — CR DG CHEST 2V
2 series · 2 of 2 positions shown · non-contrast
Comparison: 05/07/2004

CLINICAL DATA: Preop respiratory evaluation for CABG.

CHEST - 2 VIEW

[w chest pa]
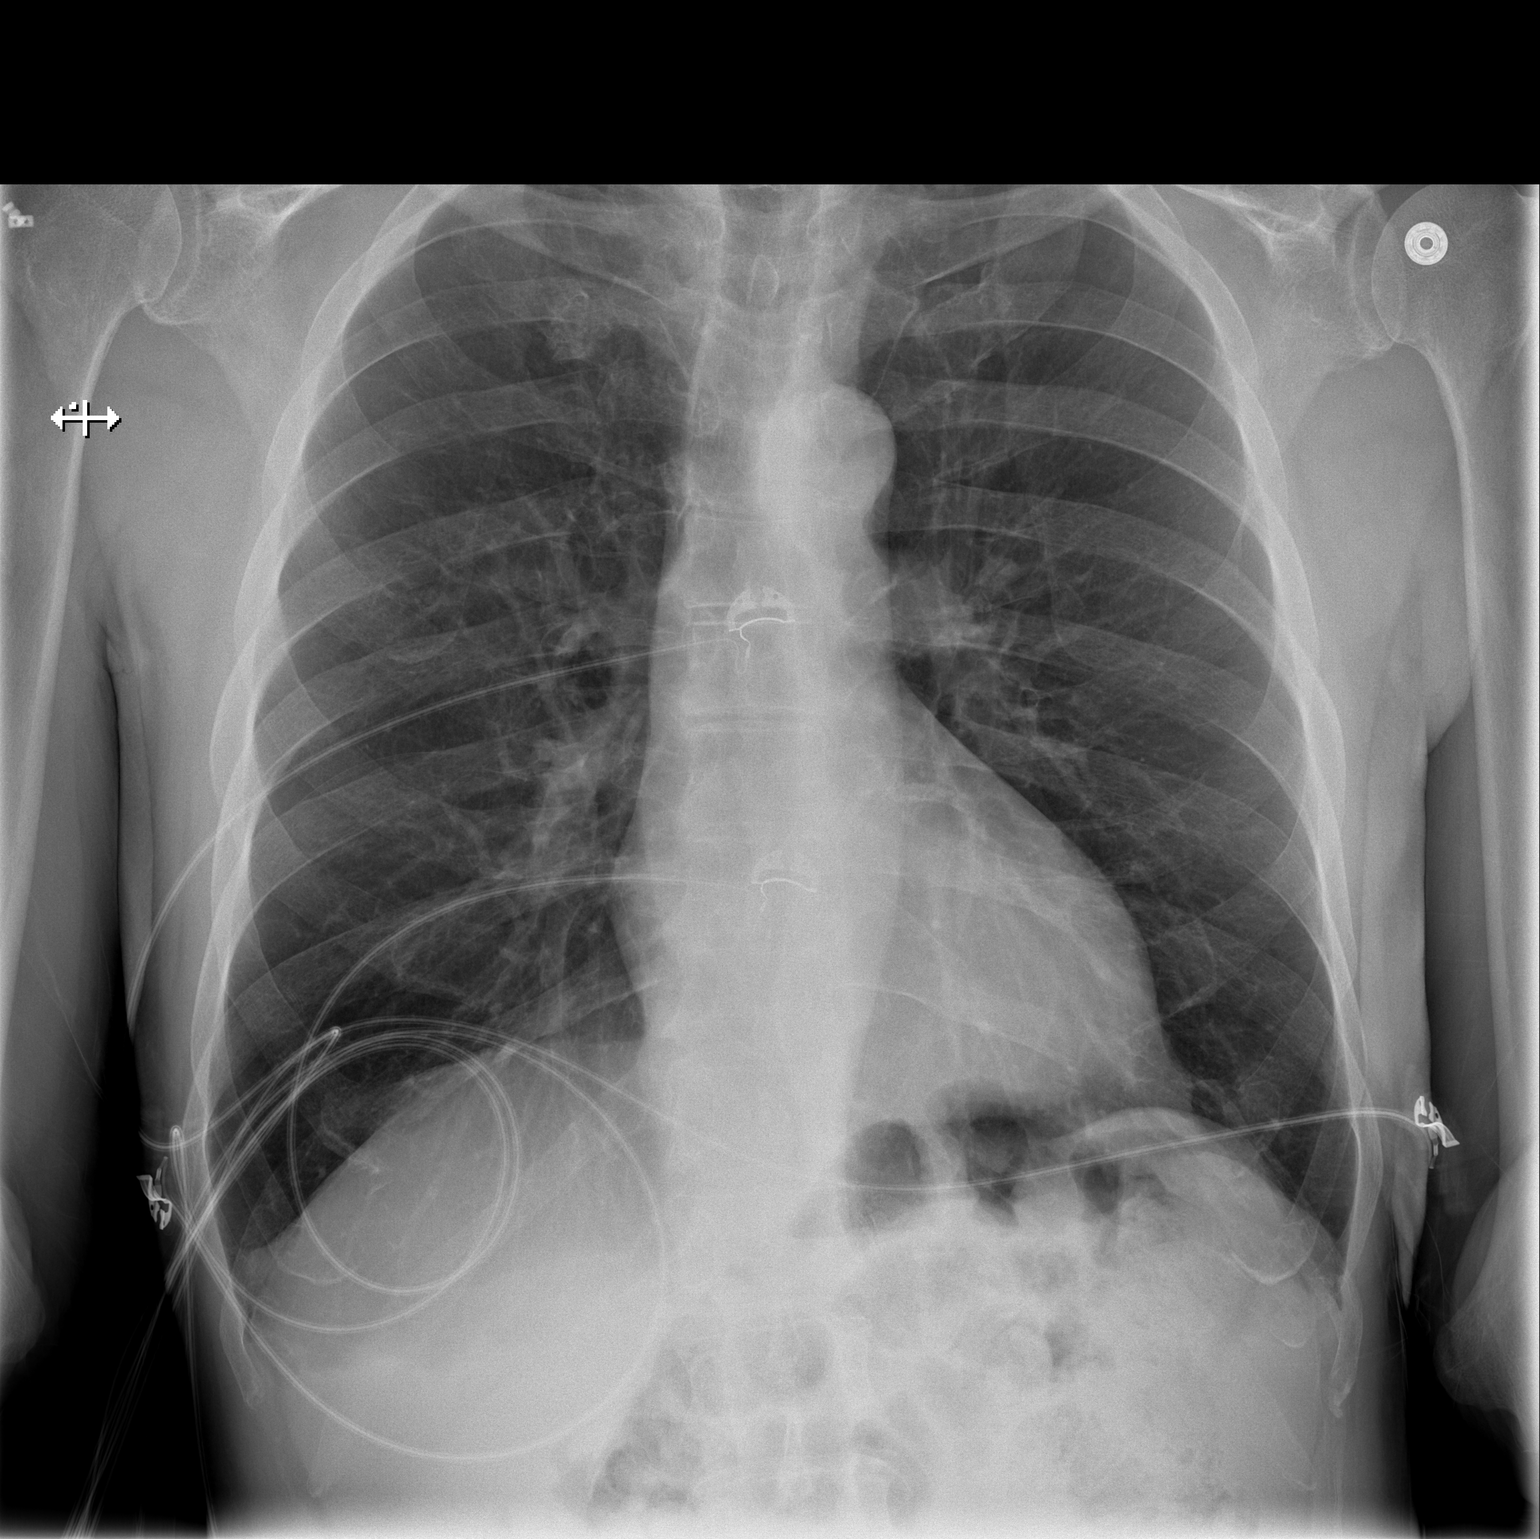

[w chest lat]
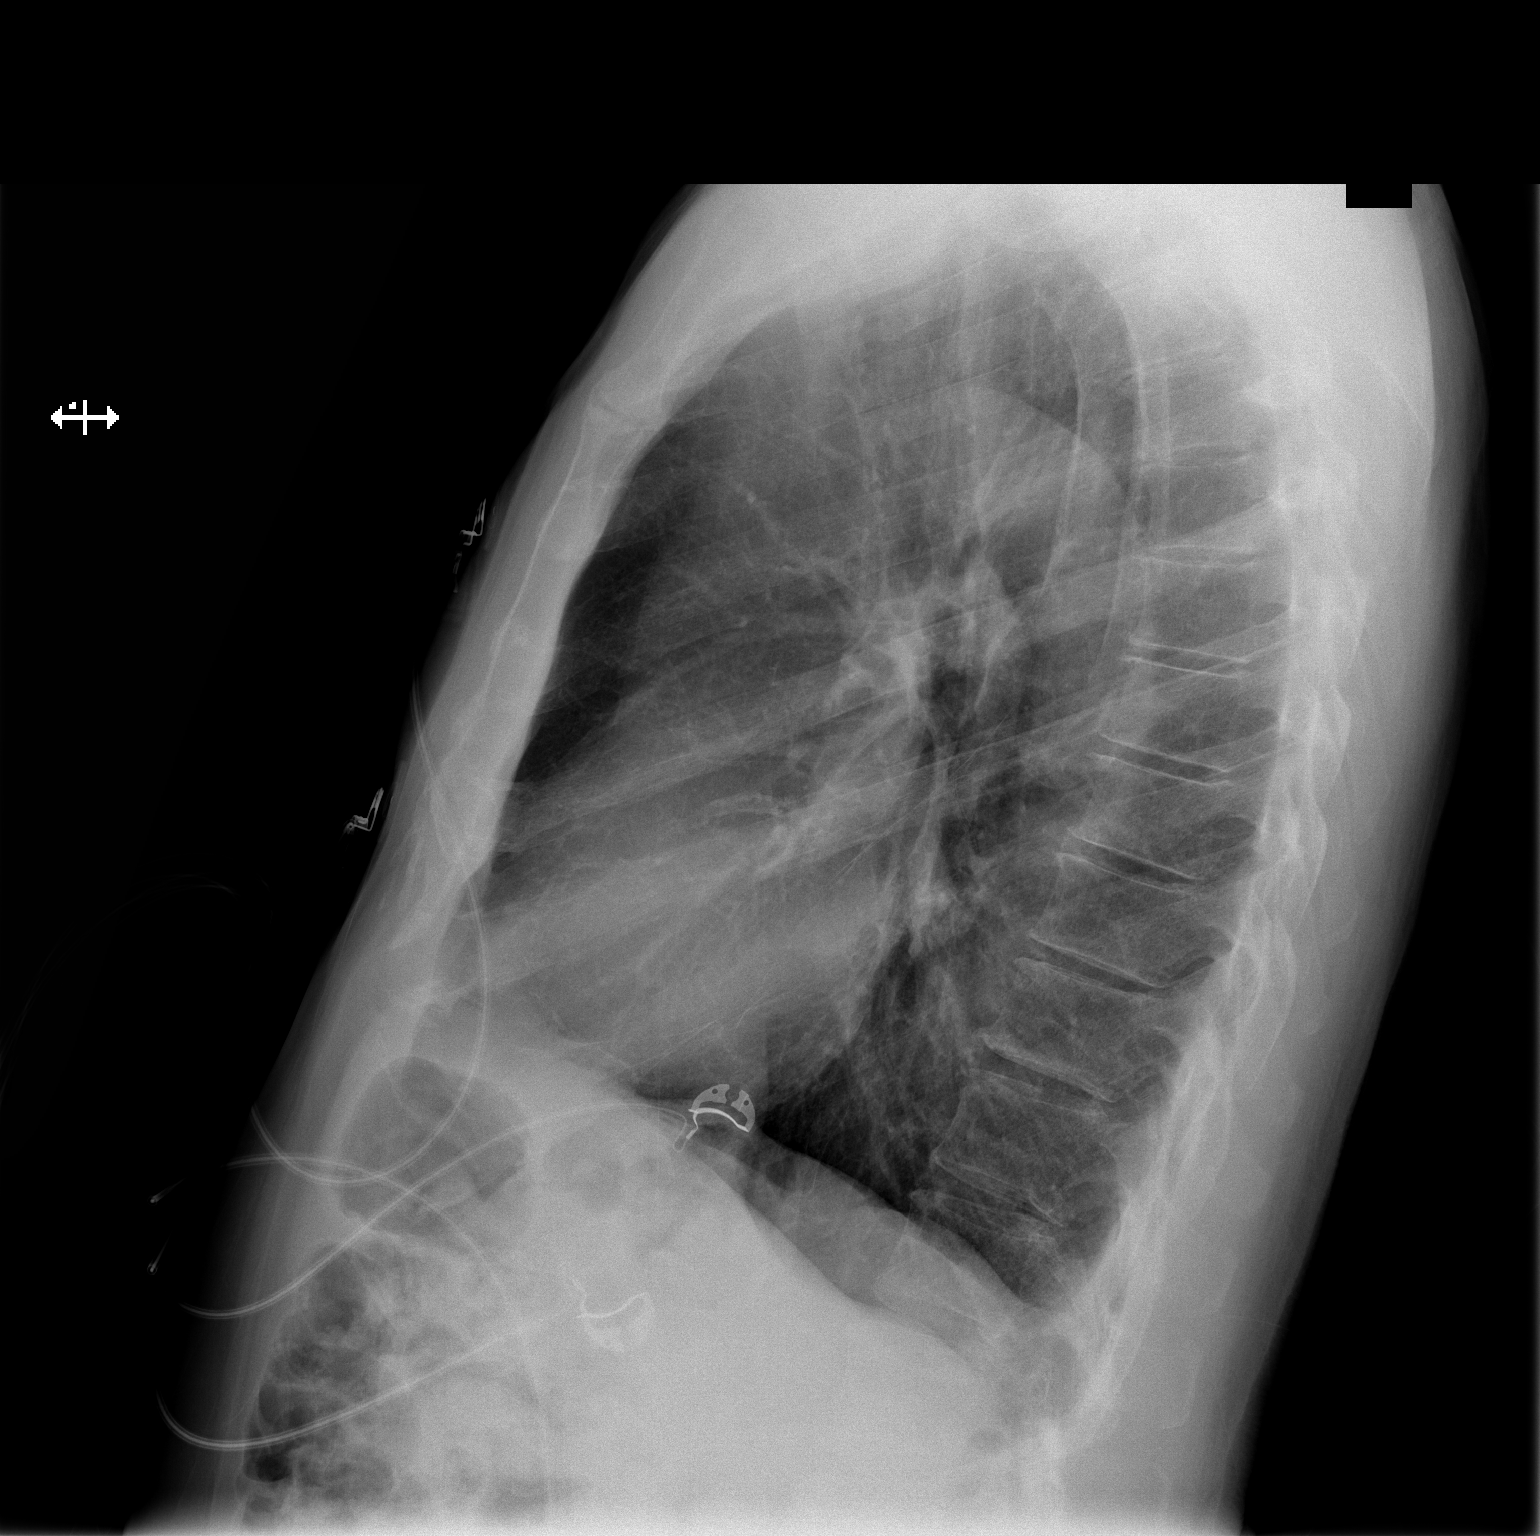

[2 of 2 positions shown; findings below may reference images not displayed]

FINDINGS: The lungs are clear without focal infiltrate, edema,
pneumothorax or pleural effusion.  There is mild bilateral
hyperexpansion.  Nodular density in the left parahilar region was
present previously and is probably a vessel on end. Imaged bony
structures of the thorax are intact. Telemetry leads overlie the
chest.
IMPRESSION: No acute cardiopulmonary findings.

## 2012-11-06 IMAGING — CR DG CHEST 1V PORT
1 series · 1 of 1 positions shown · non-contrast
Comparison: Chest 05/22/2011.

CLINICAL DATA: Status post CABG.  Chest discomfort.

PORTABLE CHEST - 1 VIEW

[AP]
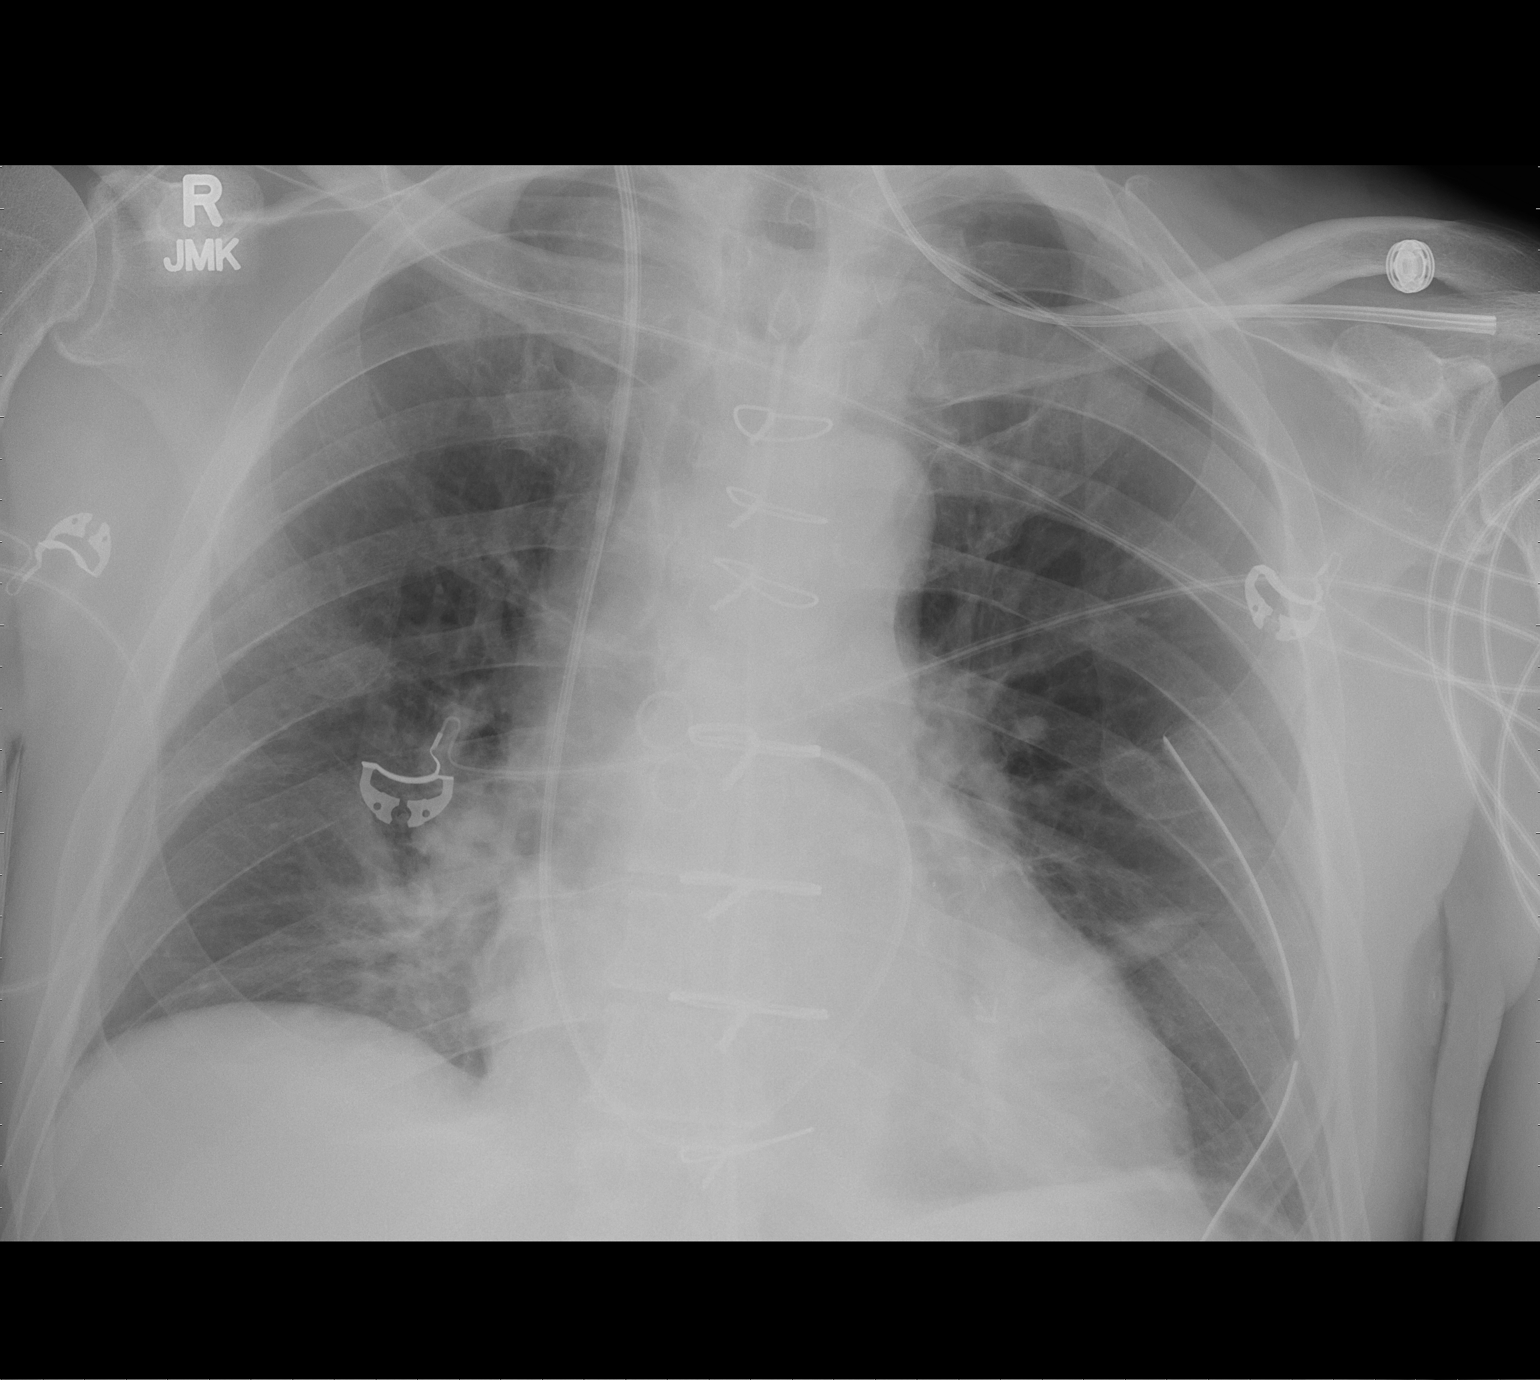

[1 of 1 positions shown; findings below may reference images not displayed]

FINDINGS: Endotracheal tube and NG tube have been removed.  Support
apparatus is otherwise unchanged.  No pneumothorax is identified.
Mild subsegmental atelectasis in the lung bases noted.  Heart size
upper normal.
IMPRESSION: 1.  Status post extubation and NG tube removal.
2.  Mild basilar subsegmental atelectasis.

## 2012-11-07 IMAGING — CR DG CHEST 1V PORT
1 series · 1 of 1 positions shown · non-contrast
Comparison: 05/23/2011

CLINICAL DATA: Postop cardiac surgery.

PORTABLE CHEST - 1 VIEW

[view not recorded]
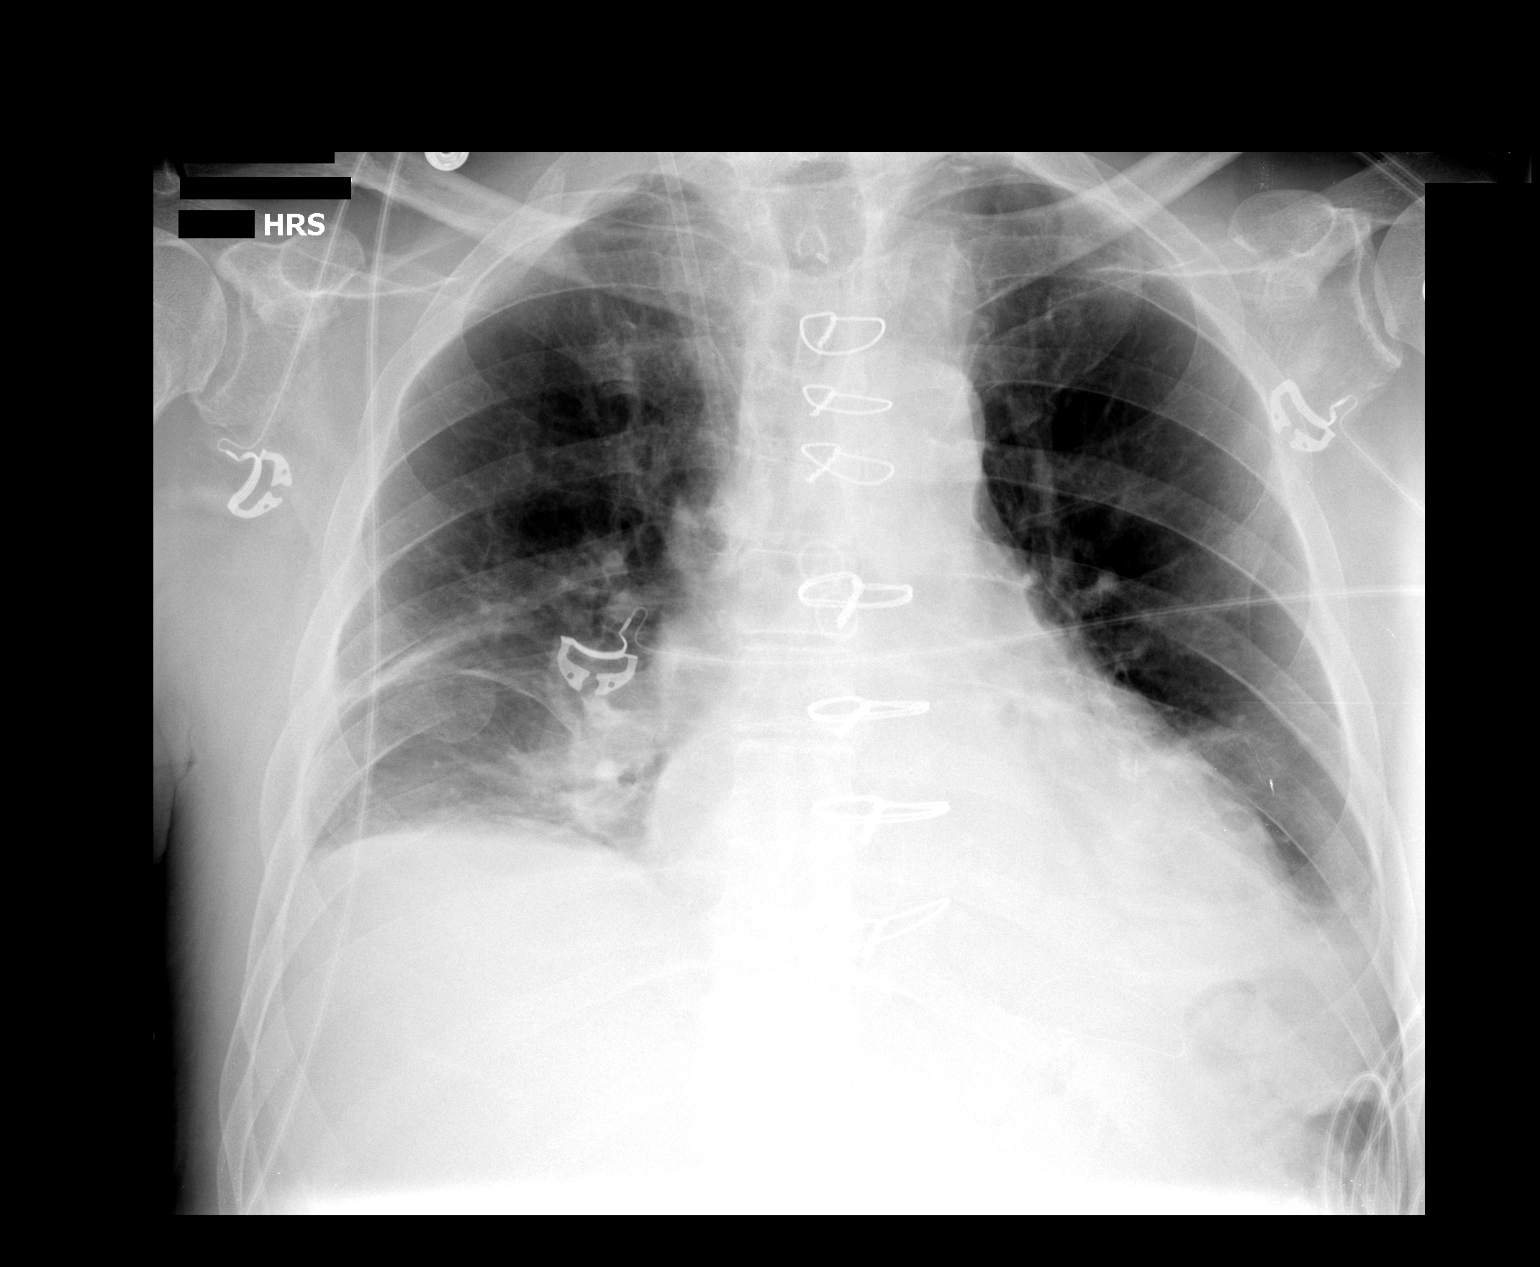

[1 of 1 positions shown; findings below may reference images not displayed]

FINDINGS: Swan-Ganz catheter has been removed.  Right IJ sheath
remains in place.  Left chest tube has been removed.  Patient has
had median sternotomy and CABG.

There is no evidence for pneumothorax following chest tube removal.

The heart is enlarged.  There is increased bibasilar atelectasis,
left greater than right.  No evidence for pulmonary edema.
IMPRESSION: 1.  Interval removal of Swan-Ganz catheter, left chest tube.
2.  Increased bibasilar atelectasis.

## 2012-11-23 DIAGNOSIS — Z961 Presence of intraocular lens: Secondary | ICD-10-CM | POA: Diagnosis not present

## 2012-11-23 DIAGNOSIS — H1820 Unspecified corneal edema: Secondary | ICD-10-CM | POA: Diagnosis not present

## 2012-12-12 DIAGNOSIS — D510 Vitamin B12 deficiency anemia due to intrinsic factor deficiency: Secondary | ICD-10-CM | POA: Diagnosis not present

## 2012-12-14 DIAGNOSIS — H1820 Unspecified corneal edema: Secondary | ICD-10-CM | POA: Diagnosis not present

## 2012-12-14 DIAGNOSIS — B0052 Herpesviral keratitis: Secondary | ICD-10-CM | POA: Diagnosis not present

## 2012-12-14 DIAGNOSIS — Z961 Presence of intraocular lens: Secondary | ICD-10-CM | POA: Diagnosis not present

## 2013-01-04 DIAGNOSIS — B0052 Herpesviral keratitis: Secondary | ICD-10-CM | POA: Diagnosis not present

## 2013-01-04 DIAGNOSIS — H1820 Unspecified corneal edema: Secondary | ICD-10-CM | POA: Diagnosis not present

## 2013-01-04 DIAGNOSIS — Z961 Presence of intraocular lens: Secondary | ICD-10-CM | POA: Diagnosis not present

## 2013-01-05 DIAGNOSIS — E236 Other disorders of pituitary gland: Secondary | ICD-10-CM | POA: Diagnosis not present

## 2013-01-05 DIAGNOSIS — R42 Dizziness and giddiness: Secondary | ICD-10-CM | POA: Diagnosis not present

## 2013-01-17 DIAGNOSIS — D510 Vitamin B12 deficiency anemia due to intrinsic factor deficiency: Secondary | ICD-10-CM | POA: Diagnosis not present

## 2013-01-23 DIAGNOSIS — E7800 Pure hypercholesterolemia, unspecified: Secondary | ICD-10-CM | POA: Diagnosis not present

## 2013-01-23 DIAGNOSIS — Z79899 Other long term (current) drug therapy: Secondary | ICD-10-CM | POA: Diagnosis not present

## 2013-01-25 DIAGNOSIS — R5381 Other malaise: Secondary | ICD-10-CM | POA: Diagnosis not present

## 2013-01-25 DIAGNOSIS — N401 Enlarged prostate with lower urinary tract symptoms: Secondary | ICD-10-CM | POA: Diagnosis not present

## 2013-02-15 DIAGNOSIS — Z961 Presence of intraocular lens: Secondary | ICD-10-CM | POA: Diagnosis not present

## 2013-02-15 DIAGNOSIS — H1820 Unspecified corneal edema: Secondary | ICD-10-CM | POA: Diagnosis not present

## 2013-02-15 DIAGNOSIS — B0052 Herpesviral keratitis: Secondary | ICD-10-CM | POA: Diagnosis not present

## 2013-02-16 ENCOUNTER — Other Ambulatory Visit: Payer: Self-pay | Admitting: Dermatology

## 2013-02-16 DIAGNOSIS — L570 Actinic keratosis: Secondary | ICD-10-CM | POA: Diagnosis not present

## 2013-02-16 DIAGNOSIS — L723 Sebaceous cyst: Secondary | ICD-10-CM | POA: Diagnosis not present

## 2013-02-16 DIAGNOSIS — L820 Inflamed seborrheic keratosis: Secondary | ICD-10-CM | POA: Diagnosis not present

## 2013-02-16 DIAGNOSIS — Z85828 Personal history of other malignant neoplasm of skin: Secondary | ICD-10-CM | POA: Diagnosis not present

## 2013-02-16 DIAGNOSIS — D485 Neoplasm of uncertain behavior of skin: Secondary | ICD-10-CM | POA: Diagnosis not present

## 2013-02-16 DIAGNOSIS — L821 Other seborrheic keratosis: Secondary | ICD-10-CM | POA: Diagnosis not present

## 2013-02-16 DIAGNOSIS — D1801 Hemangioma of skin and subcutaneous tissue: Secondary | ICD-10-CM | POA: Diagnosis not present

## 2013-02-16 DIAGNOSIS — Z85820 Personal history of malignant melanoma of skin: Secondary | ICD-10-CM | POA: Diagnosis not present

## 2013-02-21 DIAGNOSIS — D510 Vitamin B12 deficiency anemia due to intrinsic factor deficiency: Secondary | ICD-10-CM | POA: Diagnosis not present

## 2013-03-07 ENCOUNTER — Other Ambulatory Visit: Payer: Self-pay | Admitting: Dermatology

## 2013-03-07 DIAGNOSIS — D485 Neoplasm of uncertain behavior of skin: Secondary | ICD-10-CM | POA: Diagnosis not present

## 2013-03-07 DIAGNOSIS — Z85820 Personal history of malignant melanoma of skin: Secondary | ICD-10-CM | POA: Diagnosis not present

## 2013-03-07 DIAGNOSIS — Z85828 Personal history of other malignant neoplasm of skin: Secondary | ICD-10-CM | POA: Diagnosis not present

## 2013-03-07 DIAGNOSIS — D0460 Carcinoma in situ of skin of unspecified upper limb, including shoulder: Secondary | ICD-10-CM | POA: Diagnosis not present

## 2013-03-07 DIAGNOSIS — C44621 Squamous cell carcinoma of skin of unspecified upper limb, including shoulder: Secondary | ICD-10-CM | POA: Diagnosis not present

## 2013-03-24 DIAGNOSIS — Z23 Encounter for immunization: Secondary | ICD-10-CM | POA: Diagnosis not present

## 2013-03-24 DIAGNOSIS — D510 Vitamin B12 deficiency anemia due to intrinsic factor deficiency: Secondary | ICD-10-CM | POA: Diagnosis not present

## 2013-03-27 ENCOUNTER — Other Ambulatory Visit: Payer: Self-pay | Admitting: *Deleted

## 2013-03-27 DIAGNOSIS — R5381 Other malaise: Secondary | ICD-10-CM

## 2013-03-27 DIAGNOSIS — Z79899 Other long term (current) drug therapy: Secondary | ICD-10-CM

## 2013-03-27 DIAGNOSIS — E7800 Pure hypercholesterolemia, unspecified: Secondary | ICD-10-CM

## 2013-04-12 ENCOUNTER — Encounter: Payer: Self-pay | Admitting: Interventional Cardiology

## 2013-04-17 ENCOUNTER — Ambulatory Visit (INDEPENDENT_AMBULATORY_CARE_PROVIDER_SITE_OTHER): Payer: Medicare Other | Admitting: Interventional Cardiology

## 2013-04-17 ENCOUNTER — Encounter: Payer: Self-pay | Admitting: Interventional Cardiology

## 2013-04-17 VITALS — BP 134/78 | HR 77 | Ht 68.0 in | Wt 158.0 lb

## 2013-04-17 DIAGNOSIS — R42 Dizziness and giddiness: Secondary | ICD-10-CM

## 2013-04-17 DIAGNOSIS — I2510 Atherosclerotic heart disease of native coronary artery without angina pectoris: Secondary | ICD-10-CM

## 2013-04-17 DIAGNOSIS — E782 Mixed hyperlipidemia: Secondary | ICD-10-CM

## 2013-04-17 NOTE — Progress Notes (Signed)
Patient ID: Charles Grant, male   DOB: [birth year], 79 y.o.   MRN: [id number]    [address] [place], [place]  [address] Phone: [phone] Fax:  [phone]  Date:  04/17/2013   ID:  Charles Grant, DOB January 12, 1934, MRN [id number]  PCP:  Darnelle Bos, MD      History of Present Illness: Charles Grant is a 79 y.o. male who has had CAD, s/p CABG. He had a lightheaded spell in June 2013. he was found to be anemic. He thinks this is from B12 deficiency. This is now supplemented with shots and now with animal products.  He was taking Crestor daily but stopped this due to perceived side effects. His energy level has improved with increasing Vitamin D and more animal products. CAD/ASCVD:  uses exercise bike. No angina. Denies : Chest pain.  Diaphoresis.   Dyspnea on exertion.  Exercise.  Fatigue.  Leg edema.  Nitroglycerin.  Orthopnea.   Mild dizzinesss when standing up quickly.  He has had a normal hemoglobin.  Episodes were associated with nausea and perspiration and resloved after 20 minutes.    Wt Readings from Last 3 Encounters:  04/17/13 158 lb (71.668 kg)  06/25/11 150 lb 5.7 oz (68.2 kg)  06/23/11 146 lb (66.225 kg)     Past Medical History  Diagnosis Date  . Angina   . Heart murmur   . Depression   . Anemia   . Coronary artery disease 05/21/2011    Current Outpatient Prescriptions  Medication Sig Dispense Refill  . aspirin EC 81 MG tablet Take 81 mg by mouth daily.        Marland Kitchen FLUoxetine (PROZAC) 20 MG capsule Take 60 mg by mouth daily.      . folic acid (FOLVITE) 1 MG tablet Take 1 mg by mouth daily.        . sildenafil (VIAGRA) 100 MG tablet Take 100 mg by mouth daily as needed. For E.D.      . silodosin (RAPAFLO) 8 MG CAPS capsule Take 8 mg by mouth daily with breakfast.       No current facility-administered medications for this visit.    Allergies:    Allergies  Allergen Reactions  . Penicillins Hives, Swelling and Rash  .  Montelukast Sodium Other (See Comments)    When mixed with antidepressants there is a side effect    Social History:  The patient  reports that he has never smoked. He has never used smokeless tobacco. He reports that he drinks about 6.0 ounces of alcohol per week. He reports that he does not use illicit drugs.   Family History:  The patient's family history is not on file.   ROS:  Please see the history of present illness.  No nausea, vomiting.  No fevers, chills.  No focal weakness.  No dysuria.    All other systems reviewed and negative.   PHYSICAL EXAM: VS:  BP 134/78  Pulse 77  Ht 5\' 8"  (1.727 m)  Wt 158 lb (71.668 kg)  BMI 24.03 kg/m2  SpO2 93% Well nourished, well developed, in no acute distress HEENT: normal Neck: no JVD, no carotid bruits Cardiac:  normal S1, S2; RRR;  Lungs:  clear to auscultation bilaterally, no wheezing, rhonchi or rales Abd: soft, nontender, no hepatomegaly Ext: no edema Skin: warm and dry Neuro:   no focal abnormalities noted      ASSESSMENT AND PLAN:  Treatment  1. CAD in  native artery  IMAGING: EKG    Harward,Amy 10/26/2012 01:35:31 PM > Marykathleen Russi,JAY 10/26/2012 01:46:40 PM > NSR, rSR', no ST and T wave changes   Notes: No angina like what he had before CABG. Will make sure that he is taking aspirin 81 mg daily.    2. Hypercholesteremia, pure   Atorvastatin Calcium Tablet, 10 MG, 1 tablet, Orally, Once a day, Notes: stopped becasue of side effects Did not tolerate Zetia due to nervousness.   LDL target < 100.    3. Anemia  Continue MultiVitamins Tablet, 400, 1 tablet, Orally, once daily; anemia resolved.  Hgb 12.9 in 1/14.    4. Decreased energy  Continue Vitamin D Capsule, 2000 UNIT, 2 capsules, Orally, Once a day   5.  Dizziness: He should avoid standing quickly from a lying position. He stay well hydrated. When he does stand up, he should have something to lean on.  Signed, Fredric Mare, MD, [facility] 04/17/2013 2:01 PM

## 2013-04-17 NOTE — Patient Instructions (Signed)
Avoid standing up quickly and stay well hydrated. If you become lightheaded or dizzy lean up against a wall or something stable.  Your physician wants you to follow-up in: 6 months with Dr. Eldridge Dace. You will receive a reminder letter in the mail two months in advance. If you don't receive a letter, please call our office to schedule the follow-up appointment.  Your physician recommends that you continue on your current medications as directed. Please refer to the Current Medication list given to you today.

## 2013-04-19 DIAGNOSIS — N401 Enlarged prostate with lower urinary tract symptoms: Secondary | ICD-10-CM | POA: Diagnosis not present

## 2013-04-19 DIAGNOSIS — R5381 Other malaise: Secondary | ICD-10-CM | POA: Diagnosis not present

## 2013-04-19 DIAGNOSIS — N139 Obstructive and reflux uropathy, unspecified: Secondary | ICD-10-CM | POA: Diagnosis not present

## 2013-04-25 DIAGNOSIS — D510 Vitamin B12 deficiency anemia due to intrinsic factor deficiency: Secondary | ICD-10-CM | POA: Diagnosis not present

## 2013-05-08 ENCOUNTER — Other Ambulatory Visit: Payer: Medicare Other

## 2013-05-15 ENCOUNTER — Other Ambulatory Visit (INDEPENDENT_AMBULATORY_CARE_PROVIDER_SITE_OTHER): Payer: Medicare Other

## 2013-05-15 ENCOUNTER — Other Ambulatory Visit: Payer: Self-pay | Admitting: Interventional Cardiology

## 2013-05-15 DIAGNOSIS — Z79899 Other long term (current) drug therapy: Secondary | ICD-10-CM | POA: Diagnosis not present

## 2013-05-15 DIAGNOSIS — E7800 Pure hypercholesterolemia, unspecified: Secondary | ICD-10-CM | POA: Diagnosis not present

## 2013-05-15 LAB — ALT: ALT: 18 U/L (ref 0–53)

## 2013-05-16 LAB — NMR LIPOPROFILE WITH LIPIDS
HDL Size: 10 nm (ref >=9.2)
LDL Particle Number: 1333 nmol/L — ABNORMAL HIGH (ref <1000)
LDL Size: 21.8 nm (ref >20.5)
Large VLDL-P: 0.9 nmol/L (ref <=2.7)
Small LDL Particle Number: <90 nmol/L (ref <=527)
Triglycerides: 76 mg/dL (ref <150)
VLDL Size: 44.9 nm (ref <=46.6)

## 2013-05-16 IMAGING — CR DG CHEST 2V
2 series · 2 of 2 positions shown · non-contrast
Comparison: 06/23/2011

CLINICAL DATA: Nausea, vomiting, dizziness, weakness

CHEST - 2 VIEW

[w chest pa]
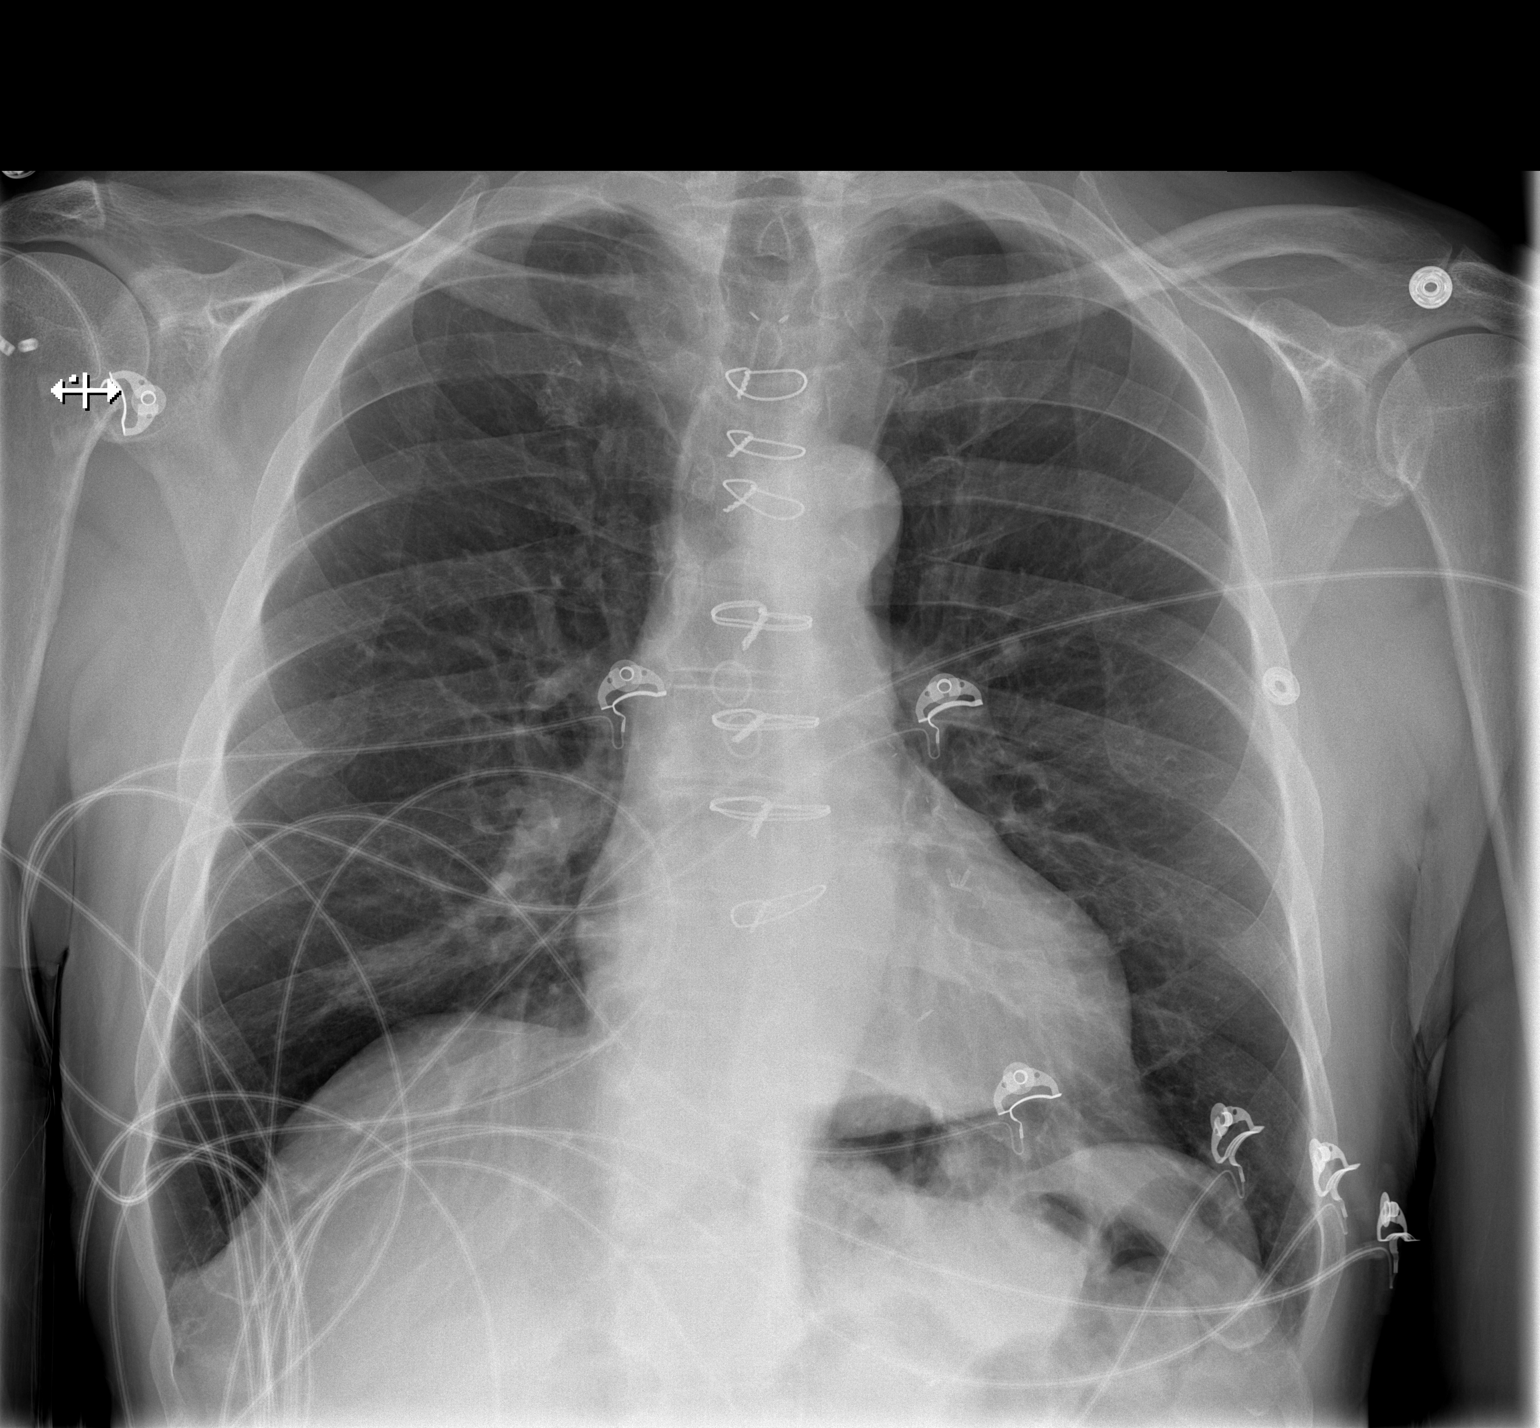

[w chest lat]
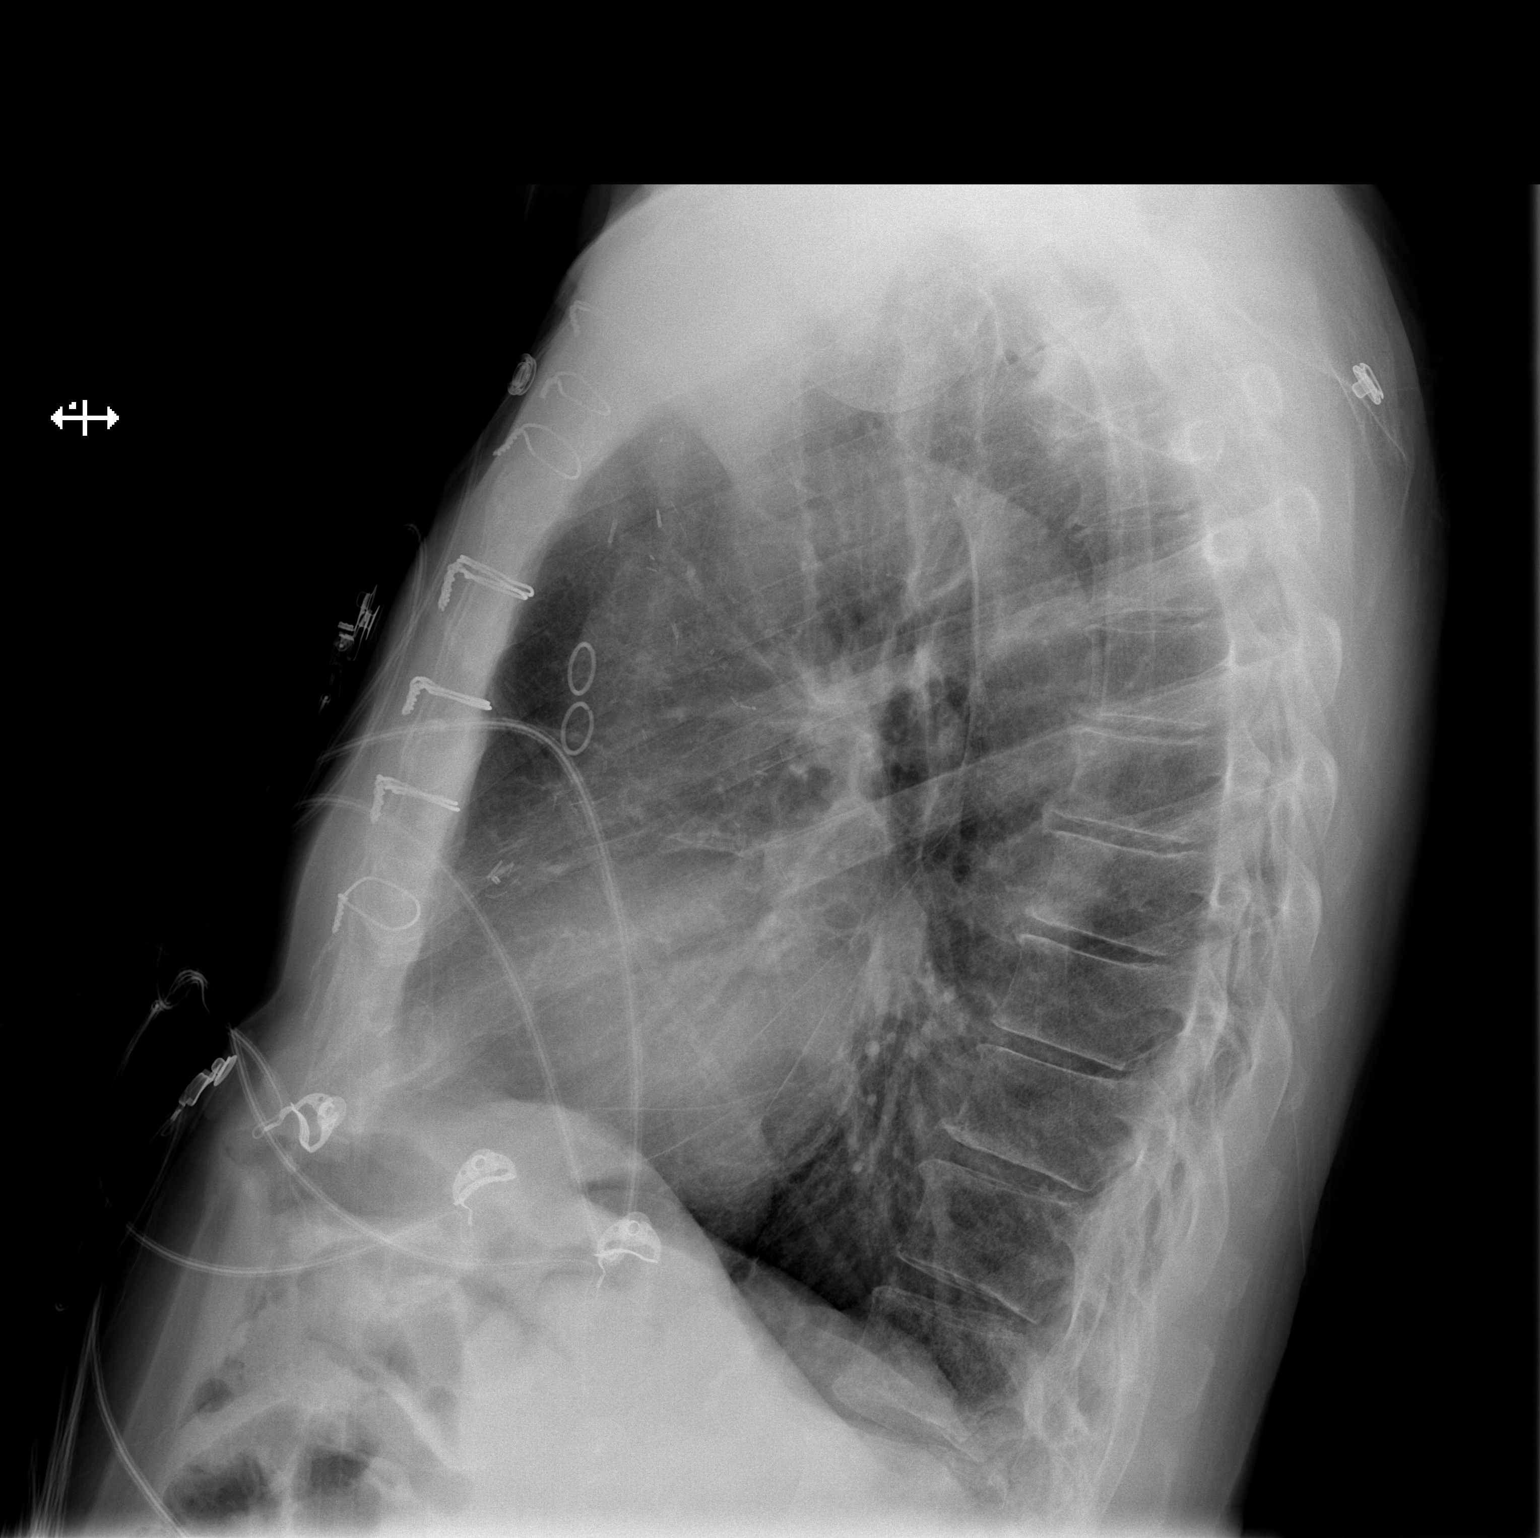

[2 of 2 positions shown; findings below may reference images not displayed]

FINDINGS: Upper normal-sized cardiac silhouette post CABG.
Tortuous aorta.
Pulmonary vascularity normal.
Lungs slightly hyperaerated but clear.
No infiltrate, pleural effusion or pneumothorax.
Numerous cardiac monitoring leads project over chest.
IMPRESSION: No acute abnormalities.

## 2013-05-17 DIAGNOSIS — Z961 Presence of intraocular lens: Secondary | ICD-10-CM | POA: Diagnosis not present

## 2013-05-17 DIAGNOSIS — H02409 Unspecified ptosis of unspecified eyelid: Secondary | ICD-10-CM | POA: Diagnosis not present

## 2013-05-17 DIAGNOSIS — H1820 Unspecified corneal edema: Secondary | ICD-10-CM | POA: Diagnosis not present

## 2013-05-18 DIAGNOSIS — L723 Sebaceous cyst: Secondary | ICD-10-CM | POA: Diagnosis not present

## 2013-05-18 DIAGNOSIS — L821 Other seborrheic keratosis: Secondary | ICD-10-CM | POA: Diagnosis not present

## 2013-05-18 DIAGNOSIS — Z85820 Personal history of malignant melanoma of skin: Secondary | ICD-10-CM | POA: Diagnosis not present

## 2013-05-18 DIAGNOSIS — D1801 Hemangioma of skin and subcutaneous tissue: Secondary | ICD-10-CM | POA: Diagnosis not present

## 2013-05-18 DIAGNOSIS — Z85828 Personal history of other malignant neoplasm of skin: Secondary | ICD-10-CM | POA: Diagnosis not present

## 2013-05-18 DIAGNOSIS — L570 Actinic keratosis: Secondary | ICD-10-CM | POA: Diagnosis not present

## 2013-05-22 DIAGNOSIS — Z79899 Other long term (current) drug therapy: Secondary | ICD-10-CM | POA: Diagnosis not present

## 2013-05-22 DIAGNOSIS — F339 Major depressive disorder, recurrent, unspecified: Secondary | ICD-10-CM | POA: Diagnosis not present

## 2013-05-22 DIAGNOSIS — I2510 Atherosclerotic heart disease of native coronary artery without angina pectoris: Secondary | ICD-10-CM | POA: Diagnosis not present

## 2013-05-22 DIAGNOSIS — E236 Other disorders of pituitary gland: Secondary | ICD-10-CM | POA: Diagnosis not present

## 2013-05-22 DIAGNOSIS — Z0000 Encounter for general adult medical examination without abnormal findings: Secondary | ICD-10-CM | POA: Diagnosis not present

## 2013-05-25 ENCOUNTER — Telehealth: Payer: Self-pay | Admitting: Cardiology

## 2013-05-25 ENCOUNTER — Encounter: Payer: Self-pay | Admitting: Cardiology

## 2013-05-25 DIAGNOSIS — I2510 Atherosclerotic heart disease of native coronary artery without angina pectoris: Secondary | ICD-10-CM

## 2013-05-25 DIAGNOSIS — IMO0001 Reserved for inherently not codable concepts without codable children: Secondary | ICD-10-CM

## 2013-05-25 DIAGNOSIS — Z79899 Other long term (current) drug therapy: Secondary | ICD-10-CM

## 2013-05-25 DIAGNOSIS — E782 Mixed hyperlipidemia: Secondary | ICD-10-CM

## 2013-05-25 DIAGNOSIS — E559 Vitamin D deficiency, unspecified: Secondary | ICD-10-CM

## 2013-05-25 MED ORDER — ROSUVASTATIN CALCIUM 10 MG PO TABS: ORAL_TABLET | ORAL | Status: DC

## 2013-05-25 MED ORDER — VITAMIN D 1000 UNITS PO CAPS: ORAL_CAPSULE | ORAL | Status: AC

## 2013-05-25 NOTE — Telephone Encounter (Signed)
Message copied by Orlene Plum H on Thu May 25, 2013  1:25 PM ------      Message from: SMART, Gaspar Skeeters      Created: Wed May 17, 2013  3:50 PM       H/o CAD and LDL-P 1333 nmol/L.        FAiled Lipitor 10 mg qd, and crestor 1-2 times per week due to perceived side effects, and possible weakness.  Stopped Zetia in the past due to nervousness.      Patient had h/o vitamin D deficiency which we gave him higher doses of vitamin D supplementation.        He tells Dr. Eldridge Dace that his energy is much better since using vitamin D supplement and animal products.        Studies have shown statins are tolerated significantly better once Vitamin D has been properly supplemented in patient.  Given his energy improved on Vitamin D, and LDL elevated in setting of CAD, would like for patient to try once weekly Crestor again.      Plan:      1.  Continue current vitamin D supplementation daily.      2.  Add Crestor 10 mg once weekly only.      3.  Check a lipid panel, hepatic panel, and Vitamin D 25-OH level in 3 months.      Please notify patient, update meds, and set up labs. Thanks. ------

## 2013-05-25 NOTE — Telephone Encounter (Signed)
Pt notified. Samples up front for pt to pick up.

## 2013-05-26 DIAGNOSIS — D510 Vitamin B12 deficiency anemia due to intrinsic factor deficiency: Secondary | ICD-10-CM | POA: Diagnosis not present

## 2013-07-05 DIAGNOSIS — N401 Enlarged prostate with lower urinary tract symptoms: Secondary | ICD-10-CM | POA: Diagnosis not present

## 2013-07-06 DIAGNOSIS — J301 Allergic rhinitis due to pollen: Secondary | ICD-10-CM | POA: Diagnosis not present

## 2013-08-11 DIAGNOSIS — D510 Vitamin B12 deficiency anemia due to intrinsic factor deficiency: Secondary | ICD-10-CM | POA: Diagnosis not present

## 2013-08-16 ENCOUNTER — Other Ambulatory Visit: Payer: Self-pay | Admitting: *Deleted

## 2013-08-16 MED ORDER — ROSUVASTATIN CALCIUM 10 MG PO TABS: ORAL_TABLET | ORAL | Status: DC

## 2013-08-28 ENCOUNTER — Other Ambulatory Visit: Payer: Medicare Other

## 2013-09-29 DIAGNOSIS — J309 Allergic rhinitis, unspecified: Secondary | ICD-10-CM | POA: Diagnosis not present

## 2013-09-29 DIAGNOSIS — D510 Vitamin B12 deficiency anemia due to intrinsic factor deficiency: Secondary | ICD-10-CM | POA: Diagnosis not present

## 2013-10-05 ENCOUNTER — Other Ambulatory Visit (INDEPENDENT_AMBULATORY_CARE_PROVIDER_SITE_OTHER): Payer: Medicare Other

## 2013-10-05 DIAGNOSIS — E559 Vitamin D deficiency, unspecified: Secondary | ICD-10-CM | POA: Diagnosis not present

## 2013-10-05 DIAGNOSIS — Z79899 Other long term (current) drug therapy: Secondary | ICD-10-CM

## 2013-10-05 DIAGNOSIS — IMO0001 Reserved for inherently not codable concepts without codable children: Secondary | ICD-10-CM

## 2013-10-05 DIAGNOSIS — E782 Mixed hyperlipidemia: Secondary | ICD-10-CM

## 2013-10-05 LAB — HEPATIC FUNCTION PANEL
ALBUMIN: 4.2 g/dL (ref 3.5–5.2)
ALT: 19 U/L (ref 0–53)
AST: 21 U/L (ref 0–37)
Alkaline Phosphatase: 46 U/L (ref 39–117)
Bilirubin, Direct: 0 mg/dL (ref 0.0–0.3)
TOTAL PROTEIN: 7.2 g/dL (ref 6.0–8.3)
Total Bilirubin: 0.8 mg/dL (ref 0.3–1.2)

## 2013-10-05 LAB — LIPID PANEL
CHOLESTEROL: 184 mg/dL (ref 0–200)
HDL: 80.6 mg/dL (ref >39.00)
LDL CALC: 97 mg/dL (ref 0–99)
TRIGLYCERIDES: 34 mg/dL (ref 0.0–149.0)
Total CHOL/HDL Ratio: 2
VLDL: 6.8 mg/dL (ref 0.0–40.0)

## 2013-10-06 LAB — VITAMIN D 25 HYDROXY (VIT D DEFICIENCY, FRACTURES): VIT D 25 HYDROXY: 53 ng/mL (ref 30–89)

## 2013-10-09 ENCOUNTER — Other Ambulatory Visit: Payer: Self-pay | Admitting: Dermatology

## 2013-10-09 DIAGNOSIS — Z85820 Personal history of malignant melanoma of skin: Secondary | ICD-10-CM | POA: Diagnosis not present

## 2013-10-09 DIAGNOSIS — L820 Inflamed seborrheic keratosis: Secondary | ICD-10-CM | POA: Diagnosis not present

## 2013-10-09 DIAGNOSIS — D485 Neoplasm of uncertain behavior of skin: Secondary | ICD-10-CM | POA: Diagnosis not present

## 2013-10-09 DIAGNOSIS — Z85828 Personal history of other malignant neoplasm of skin: Secondary | ICD-10-CM | POA: Diagnosis not present

## 2013-10-09 DIAGNOSIS — L570 Actinic keratosis: Secondary | ICD-10-CM | POA: Diagnosis not present

## 2013-10-09 DIAGNOSIS — D1801 Hemangioma of skin and subcutaneous tissue: Secondary | ICD-10-CM | POA: Diagnosis not present

## 2013-10-16 ENCOUNTER — Telehealth: Payer: Self-pay | Admitting: Interventional Cardiology

## 2013-10-16 ENCOUNTER — Other Ambulatory Visit: Payer: Self-pay | Admitting: Cardiology

## 2013-10-16 DIAGNOSIS — E782 Mixed hyperlipidemia: Secondary | ICD-10-CM

## 2013-10-16 NOTE — Telephone Encounter (Signed)
Returned pt's call.

## 2013-10-16 NOTE — Telephone Encounter (Signed)
New message    Returning Charles Grant's call from last week

## 2013-11-14 ENCOUNTER — Encounter: Payer: Self-pay | Admitting: Interventional Cardiology

## 2013-11-14 ENCOUNTER — Ambulatory Visit (INDEPENDENT_AMBULATORY_CARE_PROVIDER_SITE_OTHER): Payer: Medicare Other | Admitting: Interventional Cardiology

## 2013-11-14 VITALS — BP 159/86 | HR 69 | Ht 68.0 in | Wt 160.0 lb

## 2013-11-14 DIAGNOSIS — R030 Elevated blood-pressure reading, without diagnosis of hypertension: Secondary | ICD-10-CM

## 2013-11-14 DIAGNOSIS — I2510 Atherosclerotic heart disease of native coronary artery without angina pectoris: Secondary | ICD-10-CM

## 2013-11-14 DIAGNOSIS — E782 Mixed hyperlipidemia: Secondary | ICD-10-CM | POA: Diagnosis not present

## 2013-11-14 NOTE — Progress Notes (Signed)
Patient ID: KEVYN BOQUET, male   DOB: 07-Aug-1933, 80 y.o.   MRN: [id number] Patient ID: MOUNIR SKIPPER, male   DOB: 09-10-1933, 80 y.o.   MRN: [id number]    [place], [place], [place]  [address] Phone: [phone] Fax:  [phone]  Date:  11/14/2013   ID:  GRAYDEN BURLEY, DOB 1933/10/30, MRN [id number]  PCP:  Horton Finer, MD      History of Present Illness: GLYNDON TURSI is a 80 y.o. male who has had CAD, s/p CABG. Ocasional fatigue, lightheadedness  especially worse during the spring or hot weather. BP is well controlled at home, usually around 116-130/80. Appetite is good. He is eating healthy. Concerned too much calcium build up.   Exercises 4-5 days a week of aerobic exercise for about 40 minutes w/o chest pain or fatigue.  CAD/ASCVD:  uses exercise bike. No angina. Denies : Chest pain.  Diaphoresis.   Dyspnea on exertion.  Exercise.  Fatigue.  Leg edema.  Nitroglycerin.  Orthopnea.   Mild dizzinesss when standing up quickly.  He has had a normal hemoglobin.   Wt Readings from Last 3 Encounters:  11/14/13 160 lb (72.576 kg)  04/17/13 158 lb (71.668 kg)  06/25/11 150 lb 5.7 oz (68.2 kg)     Past Medical History  Diagnosis Date  . Angina   . Heart murmur   . Depression   . Anemia   . Coronary artery disease 05/21/2011    Current Outpatient Prescriptions  Medication Sig Dispense Refill  . aspirin EC 81 MG tablet Take 81 mg by mouth daily.        . Cholecalciferol (VITAMIN D) 1000 UNITS capsule 5,000 UNTIS DAILY      . FLUoxetine (PROZAC) 20 MG capsule Take 60 mg by mouth daily.      . folic acid (FOLVITE) 1 MG tablet Take 1 mg by mouth daily.        . rosuvastatin (CRESTOR) 10 MG tablet 1 tablet po once a week  14 tablet  0  . sildenafil (VIAGRA) 100 MG tablet Take 100 mg by mouth daily as needed. For E.D.      . silodosin (RAPAFLO) 8 MG CAPS capsule Take 8 mg by mouth daily with breakfast.      . Jalene Mullet Pollen Allergen  (GRASTEK SL) Place under the tongue daily. [place]       No current facility-administered medications for this visit.    Allergies:    Allergies  Allergen Reactions  . Penicillins Hives, Swelling and Rash  . Montelukast Sodium Other (See Comments)    When mixed with antidepressants there is a side effect    Social History:  The patient  reports that he has never smoked. He has never used smokeless tobacco. He reports that he drinks about 6 ounces of alcohol per week. He reports that he does not use illicit drugs.   Family History:  The patient's family history includes Colon cancer in his mother; Hypertension in his mother; Pancreatic cancer in his father.   ROS:  Please see the history of present illness.  No nausea, vomiting.  No fevers, chills.  No focal weakness.  No dysuria.    All other systems reviewed and negative.   PHYSICAL EXAM: VS:  BP 159/86  Pulse 69  Ht 5\' 8"  (1.727 m)  Wt 160 lb (72.576 kg)  BMI 24.33 kg/m2 Well nourished, well developed, in  no acute distress HEENT: normal Neck: no JVD, no carotid bruits Cardiac:  normal S1, S2; RRR;  Lungs:  clear to auscultation bilaterally, no wheezing, rhonchi or rales Abd: soft, nontender, no hepatomegaly Ext: no edema Skin: warm and dry Neuro:   no focal abnormalities noted      ASSESSMENT AND PLAN:  Treatment  1. CAD in native artery  IMAGING: EKG    Harward,Amy 10/26/2012 01:35:31 PM > Domingo Fuson,JAY 10/26/2012 01:46:40 PM > NSR, rSR', no ST and T wave changes   Notes: No angina like what he had before CABG. Will make sure that he is taking aspirin 81 mg daily.    2. Hypercholesteremia, pure   Atorvastatin Calcium Tablet, 10 MG, 1 tablet, Orally, Once a day, Notes: stopped becasue of side effects Did not tolerate Zetia due to nervousness.   LDL target < 100. LDL 80 in 4/15.  Particle number > 1000 in 12/14   3. Anemia  Continue MultiVitamins Tablet, 400, 1 tablet, Orally, once  daily; anemia resolved.  Hgb 12.9 in 1/14.    4. Decreased energy  Continue Vitamin D Capsule, 2000 UNIT, 2 capsules, Orally, Once a day.  Improved with vitamin D supplementation. Worse if he is exposed to allergens.    5.  Dizziness: He should avoid standing quickly from a lying position. He stay well hydrated. When he does stand up, he should have something to lean on.  Elevated blood pressure reading today: We'll not add medication at this point. He's had normal readings at home. Continue to monitor at home. Call us if he has readings above 140/90.  Signed, Mina Marble, MD, [facility] 11/14/2013 11:27 AM

## 2013-11-14 NOTE — Patient Instructions (Signed)
Your physician recommends that you continue on your current medications as directed. Please refer to the Current Medication list given to you today.  Your physician wants you to follow-up in: 6 months with Dr. Varanasi.  You will receive a reminder letter in the mail two months in advance. If you don't receive a letter, please call our office to schedule the follow-up appointment.  

## 2013-11-15 DIAGNOSIS — Z961 Presence of intraocular lens: Secondary | ICD-10-CM | POA: Diagnosis not present

## 2013-11-15 DIAGNOSIS — H179 Unspecified corneal scar and opacity: Secondary | ICD-10-CM | POA: Diagnosis not present

## 2013-11-15 DIAGNOSIS — H2640 Unspecified secondary cataract: Secondary | ICD-10-CM | POA: Diagnosis not present

## 2013-11-16 ENCOUNTER — Other Ambulatory Visit: Payer: Self-pay | Admitting: Dermatology

## 2013-11-16 DIAGNOSIS — L570 Actinic keratosis: Secondary | ICD-10-CM | POA: Diagnosis not present

## 2013-11-16 DIAGNOSIS — Z85820 Personal history of malignant melanoma of skin: Secondary | ICD-10-CM | POA: Diagnosis not present

## 2013-11-16 DIAGNOSIS — D1801 Hemangioma of skin and subcutaneous tissue: Secondary | ICD-10-CM | POA: Diagnosis not present

## 2013-11-16 DIAGNOSIS — L821 Other seborrheic keratosis: Secondary | ICD-10-CM | POA: Diagnosis not present

## 2013-11-16 DIAGNOSIS — D485 Neoplasm of uncertain behavior of skin: Secondary | ICD-10-CM | POA: Diagnosis not present

## 2013-11-16 DIAGNOSIS — Z85828 Personal history of other malignant neoplasm of skin: Secondary | ICD-10-CM | POA: Diagnosis not present

## 2013-11-16 DIAGNOSIS — L905 Scar conditions and fibrosis of skin: Secondary | ICD-10-CM | POA: Diagnosis not present

## 2014-01-03 DIAGNOSIS — N139 Obstructive and reflux uropathy, unspecified: Secondary | ICD-10-CM | POA: Diagnosis not present

## 2014-01-03 DIAGNOSIS — N401 Enlarged prostate with lower urinary tract symptoms: Secondary | ICD-10-CM | POA: Diagnosis not present

## 2014-01-15 DIAGNOSIS — Z961 Presence of intraocular lens: Secondary | ICD-10-CM | POA: Diagnosis not present

## 2014-01-15 DIAGNOSIS — H179 Unspecified corneal scar and opacity: Secondary | ICD-10-CM | POA: Diagnosis not present

## 2014-01-15 DIAGNOSIS — H2640 Unspecified secondary cataract: Secondary | ICD-10-CM | POA: Diagnosis not present

## 2014-02-15 DIAGNOSIS — Z85820 Personal history of malignant melanoma of skin: Secondary | ICD-10-CM | POA: Diagnosis not present

## 2014-02-15 DIAGNOSIS — L570 Actinic keratosis: Secondary | ICD-10-CM | POA: Diagnosis not present

## 2014-02-15 DIAGNOSIS — Z85828 Personal history of other malignant neoplasm of skin: Secondary | ICD-10-CM | POA: Diagnosis not present

## 2014-02-15 DIAGNOSIS — J301 Allergic rhinitis due to pollen: Secondary | ICD-10-CM | POA: Diagnosis not present

## 2014-02-15 DIAGNOSIS — D1801 Hemangioma of skin and subcutaneous tissue: Secondary | ICD-10-CM | POA: Diagnosis not present

## 2014-02-15 DIAGNOSIS — L821 Other seborrheic keratosis: Secondary | ICD-10-CM | POA: Diagnosis not present

## 2014-03-12 DIAGNOSIS — M65331 Trigger finger, right middle finger: Secondary | ICD-10-CM | POA: Diagnosis not present

## 2014-03-30 DIAGNOSIS — Z23 Encounter for immunization: Secondary | ICD-10-CM | POA: Diagnosis not present

## 2014-04-06 ENCOUNTER — Other Ambulatory Visit: Payer: Medicare Other

## 2014-04-16 ENCOUNTER — Other Ambulatory Visit (INDEPENDENT_AMBULATORY_CARE_PROVIDER_SITE_OTHER): Payer: Medicare Other | Admitting: *Deleted

## 2014-04-16 DIAGNOSIS — E782 Mixed hyperlipidemia: Secondary | ICD-10-CM

## 2014-04-16 LAB — LIPID PANEL
CHOLESTEROL: 215 mg/dL — AB (ref 0–200)
HDL: 64.9 mg/dL (ref >39.00)
LDL CALC: 136 mg/dL — AB (ref 0–99)
NonHDL: 150.1
Total CHOL/HDL Ratio: 3
Triglycerides: 71 mg/dL (ref 0.0–149.0)
VLDL: 14.2 mg/dL (ref 0.0–40.0)

## 2014-04-16 LAB — HEPATIC FUNCTION PANEL
ALT: 14 U/L (ref 0–53)
AST: 20 U/L (ref 0–37)
Albumin: 3.4 g/dL — ABNORMAL LOW (ref 3.5–5.2)
Alkaline Phosphatase: 39 U/L (ref 39–117)
Bilirubin, Direct: 0.1 mg/dL (ref 0.0–0.3)
Total Bilirubin: 0.8 mg/dL (ref 0.2–1.2)
Total Protein: 6.5 g/dL (ref 6.0–8.3)

## 2014-04-17 DIAGNOSIS — M65331 Trigger finger, right middle finger: Secondary | ICD-10-CM | POA: Diagnosis not present

## 2014-04-24 ENCOUNTER — Telehealth: Payer: Self-pay | Admitting: Pharmacist

## 2014-04-24 MED ORDER — PRAVASTATIN SODIUM 10 MG PO TABS: 5.0000 mg | ORAL_TABLET | Freq: Every day | ORAL | Status: DC

## 2014-04-24 NOTE — Telephone Encounter (Signed)
Discussed Lipid results with pt.  LDL increased due to stopping Crestor.  He is willing to try pravastatin 5mg  daily.  Will try this and have him follow up with Dr. Irish Lack in December as previously scheduled.

## 2014-05-14 ENCOUNTER — Encounter: Payer: Self-pay | Admitting: Interventional Cardiology

## 2014-05-14 ENCOUNTER — Ambulatory Visit (INDEPENDENT_AMBULATORY_CARE_PROVIDER_SITE_OTHER): Payer: Medicare Other | Admitting: Interventional Cardiology

## 2014-05-14 ENCOUNTER — Telehealth: Payer: Self-pay

## 2014-05-14 VITALS — BP 128/68 | HR 75 | Ht 68.0 in | Wt 159.0 lb

## 2014-05-14 DIAGNOSIS — E559 Vitamin D deficiency, unspecified: Secondary | ICD-10-CM | POA: Diagnosis not present

## 2014-05-14 DIAGNOSIS — E7800 Pure hypercholesterolemia, unspecified: Secondary | ICD-10-CM

## 2014-05-14 DIAGNOSIS — I2510 Atherosclerotic heart disease of native coronary artery without angina pectoris: Secondary | ICD-10-CM | POA: Diagnosis not present

## 2014-05-14 DIAGNOSIS — R5383 Other fatigue: Secondary | ICD-10-CM | POA: Insufficient documentation

## 2014-05-14 DIAGNOSIS — E782 Mixed hyperlipidemia: Secondary | ICD-10-CM

## 2014-05-14 NOTE — Telephone Encounter (Signed)
Called patient to inform him that Vitamin D level would not be covered at this time with his insurance. Also informed him that if he wanted to have it drawn it would be an out of pocket expense. Patient decided he did not need this lab done, and would follow-up if he felt his energy level dropping. Patient verbalized understanding of the situation and had no further questions.  Ewell Poe RN

## 2014-05-14 NOTE — Patient Instructions (Signed)
Your physician recommends that you continue on your current medications as directed. Please refer to the Current Medication list given to you today.  Your physician wants you to follow-up in: 6 months with Dr. Irish Lack. You will receive a reminder letter in the mail two months in advance. If you don't receive a letter, please call our office to schedule the follow-up appointment.  Your physician recommends that you return for lab work in: Fasting Lipid/liver/vitamin D level. July 31, 2014. Lab opens at 7:30am.

## 2014-05-14 NOTE — Progress Notes (Signed)
Patient ID: Charles Grant, male   DOB: [birth year], 80 y.o.   MRN: [id number]    [place], [place], [place]  [address] Phone: [phone] Fax:  [phone]  Date:  05/14/2014   ID:  Charles Grant, DOB [birth year], MRN [id number]  PCP:  Horton Finer, MD      History of Present Illness: Charles Grant is a 80 y.o. male who has had CAD, s/p CABG in 12/12. Ocasional fatigue, lightheadedness  especially worse during the spring or hot weather. BP is well controlled at home, usually around 116-130/80. Appetite is good. He is eating healthy. Concerned too much about calcium build up.    Continues to Exercise 4-5 days a week of aerobic exercise for about 40 minutes w/o chest pain or fatigue.  HR to 117.  On some days, he may do a little less. Does jumping jacks. CAD/ASCVD:  uses exercise bike. No angina.  Nothing lke sx prior to CABG.  Denies : Chest pain.  Diaphoresis.   Dyspnea on exertion.  Exercise.  Fatigue.  Leg edema.  Nitroglycerin.  Orthopnea.   Mild dizzinesss when standing up quickly.  Anemia resolved and has not recurred by most recent check.  Crestor was started in Dec, but he stopped it due to myalgia.  He then started pravastatin every other day.  He is tolerating this well.  Back and forth b/w [place] and [place].   Wt Readings from Last 3 Encounters:  05/14/14 159 lb (72.122 kg)  11/14/13 160 lb (72.576 kg)  04/17/13 158 lb (71.668 kg)     Past Medical History  Diagnosis Date  . Angina   . Heart murmur   . Depression   . Anemia   . Coronary artery disease 05/21/2011    Current Outpatient Prescriptions  Medication Sig Dispense Refill  . aspirin EC 81 MG tablet Take 81 mg by mouth daily.      . Cholecalciferol (VITAMIN D) 1000 UNITS capsule 5,000 UNTIS DAILY    . FLUoxetine (PROZAC) 20 MG capsule Take 60 mg by mouth daily.    . folic acid (FOLVITE) 1 MG tablet Take 1 mg by mouth daily.      . pravastatin (PRAVACHOL) 10 MG tablet Take  0.5 tablets (5 mg total) by mouth daily. 45 tablet 2  . sildenafil (VIAGRA) 100 MG tablet Take 100 mg by mouth daily as needed. For E.D.    . silodosin (RAPAFLO) 8 MG CAPS capsule Take 8 mg by mouth daily with breakfast.    . Jalene Mullet Pollen Allergen (GRASTEK SL) Place under the tongue daily. [place]     No current facility-administered medications for this visit.    Allergies:    Allergies  Allergen Reactions  . Penicillins Hives, Swelling and Rash  . Montelukast Sodium Other (See Comments)    When mixed with antidepressants there is a side effect    Social History:  The patient  reports that he has never smoked. He has never used smokeless tobacco. He reports that he drinks about 6.0 oz of alcohol per week. He reports that he does not use illicit drugs.   Family History:  The patient's family history includes Colon cancer in his mother; Hypertension in his mother; Pancreatic cancer in his father.   ROS:  Please see the history of present illness.  No nausea, vomiting.  No fevers, chills.  No focal weakness.  No dysuria.  All other systems reviewed and negative.   PHYSICAL EXAM: VS:  BP 128/68 mmHg  Pulse 75  Ht 5\' 8"  (1.727 m)  Wt 159 lb (72.122 kg)  BMI 24.18 kg/m2  SpO2 98% Well nourished, well developed, in no acute distress HEENT: normal Neck: no JVD, no carotid bruits Cardiac:  normal S1, S2; RRR;  Lungs:  clear to auscultation bilaterally, no wheezing, rhonchi or rales Abd: soft, nontender, no hepatomegaly Ext: no edema Skin: warm and dry Neuro:   no focal abnormalities noted Psych: normal affect      ASSESSMENT AND PLAN:  Treatment  1. CAD in native artery  IMAGING: EKG    Harward,Amy 10/26/2012 01:35:31 PM > Betul Brisky,JAY 10/26/2012 01:46:40 PM > NSR, rSR', no ST and T wave changes   Notes: No angina like what he had before CABG. Stressed importance of taking aspirin 81 mg daily. Continue regular exercise.    2.  Hypercholesteremia, pure   Pravastatin Calcium Tablet, 10 MG, half tablet, Orally, Once a day, Notes: stopped becasue of side effects Did not tolerate Zetia due to nervousness, atorvastatin and crestor due to myalgia.   LDL target < 100. LDL 80 in 4/15.  Particle number > 1000 in 12/14.  LDL 136 in 11/15 with LDL-p 1333.  Added Crestor 10 mg once a week- did not tolerate.  Then started on PravastatinQOD-still with myalgias even on 5 mg QOD.  He would be excited to try the PCSK9 inhibitor.    Could recheck lipids in 3 months.  Consider PCSK-9 inhibitor before then.   3. Anemia  Continue MultiVitamins Tablet, 400, 1 tablet, Orally, once daily; anemia resolved with vitamin supplementation.  Hgb 12.9 in 1/14.    4. Decreased energy  Decrease Vitamin D Capsule to 2000 UNIT, 1 capsules, Orally, Once a day.  Improved energy with vitamin D supplementation.Vit D well above lower limit of normal (53) in 4/15.  Recheck in 2/16. Worse if he is exposed to allergens. Wants a lower dose due to concerns of calcium buildup.   5.  Dizziness: Resolved.  Elevated blood pressure reading in the past:  He's had normal readings at home. Continue to monitor at home. Call us if he has readings above 140/90.  He is considering chelation therapy.  He wil let us know the exact ingredients of anything he takes.  We discussed that there has been limited date on chelation therapy preventing cardiac outcomes.   Signed, Mina Marble, MD, [facility] 05/14/2014 9:05 AM

## 2014-05-23 DIAGNOSIS — E559 Vitamin D deficiency, unspecified: Secondary | ICD-10-CM | POA: Diagnosis not present

## 2014-05-23 DIAGNOSIS — E780 Pure hypercholesterolemia: Secondary | ICD-10-CM | POA: Diagnosis not present

## 2014-05-23 DIAGNOSIS — Z1389 Encounter for screening for other disorder: Secondary | ICD-10-CM | POA: Diagnosis not present

## 2014-05-23 DIAGNOSIS — F339 Major depressive disorder, recurrent, unspecified: Secondary | ICD-10-CM | POA: Diagnosis not present

## 2014-05-23 DIAGNOSIS — Z23 Encounter for immunization: Secondary | ICD-10-CM | POA: Diagnosis not present

## 2014-05-23 DIAGNOSIS — D510 Vitamin B12 deficiency anemia due to intrinsic factor deficiency: Secondary | ICD-10-CM | POA: Diagnosis not present

## 2014-05-23 DIAGNOSIS — Z0001 Encounter for general adult medical examination with abnormal findings: Secondary | ICD-10-CM | POA: Diagnosis not present

## 2014-05-23 DIAGNOSIS — E871 Hypo-osmolality and hyponatremia: Secondary | ICD-10-CM | POA: Diagnosis not present

## 2014-05-24 DIAGNOSIS — M65331 Trigger finger, right middle finger: Secondary | ICD-10-CM | POA: Diagnosis not present

## 2014-05-25 ENCOUNTER — Ambulatory Visit (INDEPENDENT_AMBULATORY_CARE_PROVIDER_SITE_OTHER): Payer: Medicare Other | Admitting: Pharmacist

## 2014-05-25 DIAGNOSIS — I2510 Atherosclerotic heart disease of native coronary artery without angina pectoris: Secondary | ICD-10-CM

## 2014-05-25 DIAGNOSIS — E782 Mixed hyperlipidemia: Secondary | ICD-10-CM | POA: Diagnosis not present

## 2014-05-25 NOTE — Progress Notes (Signed)
HPI:  [name] is a very pleasant 80 yo male referred to lipid clinic by Dr. Irish Lack. He has a history of angina and CAD s/p CABG in 12/12.  Treatment history:  Pt did not tolerate Zetia due to nervousness, Lipitor and Crestor (daily and once weekly) due to myalgias. In the beginning of December 2015, pt was started on pravastatin 5mg  QOD, although hr reports he no longer takes this.  Diet: Pt reports eating fruit, veggies, eggs, and steel cut oats. He drinks almond milk, avoids fried food, but does eat red meat or seafood ~4x/week.  Exercise: Pt stays active by exercising 4-5 times/week. He uses an exercise bike, lifts light weights, and does jumping jacks.   Labs: 04/16/2014: TC 215, TG 71, HDL 65, LDL 136 10/05/2013: TC 184, TG 34, HDL 81, LDL 97  Current Outpatient Prescriptions on File Prior to Visit  Medication Sig Dispense Refill  . aspirin EC 81 MG tablet Take 81 mg by mouth daily.      . Cholecalciferol (VITAMIN D) 1000 UNITS capsule 5,000 UNTIS DAILY    . FLUoxetine (PROZAC) 20 MG capsule Take 60 mg by mouth daily.    . folic acid (FOLVITE) 1 MG tablet Take 1 mg by mouth daily.      . pravastatin (PRAVACHOL) 10 MG tablet Take 0.5 tablets (5 mg total) by mouth daily. 45 tablet 2  . sildenafil (VIAGRA) 100 MG tablet Take 100 mg by mouth daily as needed. For E.D.    . silodosin (RAPAFLO) 8 MG CAPS capsule Take 8 mg by mouth daily with breakfast.    . Jalene Mullet Pollen Allergen (GRASTEK SL) Place under the tongue daily. [place]     No current facility-administered medications on file prior to visit.   Plan: Discussed addition of PCSK-9 inhibitor with patient. Pt is excited and willing to starting therapy right away. Instructed him on injection technique and he self-administered his first Praluent injection during the visit. Will plan to recheck labs in 8 weeks and follow up at this time.

## 2014-05-25 NOTE — Assessment & Plan Note (Signed)
Discussed addition of PCSK-9 inhibitor with patient.Pt is excited and willing to starting therapy right away. Instructed him on injection technique and he self-administered his first Praluent injection during the visit. Will plan to recheck labs in 8 weeks and f/u at this time.

## 2014-06-14 DIAGNOSIS — L570 Actinic keratosis: Secondary | ICD-10-CM | POA: Diagnosis not present

## 2014-06-14 DIAGNOSIS — Z85820 Personal history of malignant melanoma of skin: Secondary | ICD-10-CM | POA: Diagnosis not present

## 2014-06-14 DIAGNOSIS — J301 Allergic rhinitis due to pollen: Secondary | ICD-10-CM | POA: Diagnosis not present

## 2014-06-14 DIAGNOSIS — L821 Other seborrheic keratosis: Secondary | ICD-10-CM | POA: Diagnosis not present

## 2014-06-14 DIAGNOSIS — L812 Freckles: Secondary | ICD-10-CM | POA: Diagnosis not present

## 2014-06-14 DIAGNOSIS — D1801 Hemangioma of skin and subcutaneous tissue: Secondary | ICD-10-CM | POA: Diagnosis not present

## 2014-06-14 DIAGNOSIS — Z85828 Personal history of other malignant neoplasm of skin: Secondary | ICD-10-CM | POA: Diagnosis not present

## 2014-06-15 DIAGNOSIS — H179 Unspecified corneal scar and opacity: Secondary | ICD-10-CM | POA: Diagnosis not present

## 2014-06-15 DIAGNOSIS — Z961 Presence of intraocular lens: Secondary | ICD-10-CM | POA: Diagnosis not present

## 2014-06-15 DIAGNOSIS — H26492 Other secondary cataract, left eye: Secondary | ICD-10-CM | POA: Diagnosis not present

## 2014-07-11 DIAGNOSIS — R350 Frequency of micturition: Secondary | ICD-10-CM | POA: Diagnosis not present

## 2014-07-11 DIAGNOSIS — N401 Enlarged prostate with lower urinary tract symptoms: Secondary | ICD-10-CM | POA: Diagnosis not present

## 2014-07-30 DIAGNOSIS — J301 Allergic rhinitis due to pollen: Secondary | ICD-10-CM | POA: Diagnosis not present

## 2014-07-31 ENCOUNTER — Other Ambulatory Visit (INDEPENDENT_AMBULATORY_CARE_PROVIDER_SITE_OTHER): Payer: Medicare Other | Admitting: *Deleted

## 2014-07-31 DIAGNOSIS — E782 Mixed hyperlipidemia: Secondary | ICD-10-CM

## 2014-07-31 LAB — HEPATIC FUNCTION PANEL
ALBUMIN: 4.2 g/dL (ref 3.5–5.2)
ALT: 16 U/L (ref 0–53)
AST: 19 U/L (ref 0–37)
Alkaline Phosphatase: 42 U/L (ref 39–117)
BILIRUBIN DIRECT: 0.1 mg/dL (ref 0.0–0.3)
TOTAL PROTEIN: 6.9 g/dL (ref 6.0–8.3)
Total Bilirubin: 0.5 mg/dL (ref 0.2–1.2)

## 2014-07-31 LAB — LIPID PANEL
CHOL/HDL RATIO: 2
CHOLESTEROL: 167 mg/dL (ref 0–200)
HDL: 80.1 mg/dL (ref >39.00)
LDL CALC: 77 mg/dL (ref 0–99)
NonHDL: 86.9
TRIGLYCERIDES: 48 mg/dL (ref 0.0–149.0)
VLDL: 9.6 mg/dL (ref 0.0–40.0)

## 2014-08-02 ENCOUNTER — Ambulatory Visit: Payer: Medicare Other | Admitting: Pharmacist

## 2014-08-07 ENCOUNTER — Telehealth: Payer: Self-pay | Admitting: Pharmacist

## 2014-08-07 DIAGNOSIS — E782 Mixed hyperlipidemia: Secondary | ICD-10-CM

## 2014-08-07 NOTE — Telephone Encounter (Signed)
Called Mr. Dolecki to follow-up with lab results from February 2016 after starting Praluent for HLD in December 2015. LDL has dropped from 136 to 77. Patient reports that there have been no issues with delivery of his Praluent and that he is tolerating it well. Informed patient that he will not need follow-up lipids for another 6 months.  Megan E. Supple, Pharm.D Clinical Pharmacy Resident Pager: [phone] 08/07/2014 2:36 PM

## 2014-10-04 DIAGNOSIS — Z880 Allergy status to penicillin: Secondary | ICD-10-CM | POA: Diagnosis not present

## 2014-10-04 DIAGNOSIS — J301 Allergic rhinitis due to pollen: Secondary | ICD-10-CM | POA: Diagnosis not present

## 2014-12-14 DIAGNOSIS — Z85828 Personal history of other malignant neoplasm of skin: Secondary | ICD-10-CM | POA: Diagnosis not present

## 2014-12-14 DIAGNOSIS — L821 Other seborrheic keratosis: Secondary | ICD-10-CM | POA: Diagnosis not present

## 2014-12-14 DIAGNOSIS — Z85820 Personal history of malignant melanoma of skin: Secondary | ICD-10-CM | POA: Diagnosis not present

## 2014-12-14 DIAGNOSIS — L570 Actinic keratosis: Secondary | ICD-10-CM | POA: Diagnosis not present

## 2014-12-14 DIAGNOSIS — L723 Sebaceous cyst: Secondary | ICD-10-CM | POA: Diagnosis not present

## 2014-12-14 DIAGNOSIS — D1801 Hemangioma of skin and subcutaneous tissue: Secondary | ICD-10-CM | POA: Diagnosis not present

## 2014-12-14 DIAGNOSIS — L812 Freckles: Secondary | ICD-10-CM | POA: Diagnosis not present

## 2015-01-16 ENCOUNTER — Other Ambulatory Visit: Payer: Self-pay | Admitting: Pharmacist

## 2015-01-16 DIAGNOSIS — E782 Mixed hyperlipidemia: Secondary | ICD-10-CM

## 2015-01-17 ENCOUNTER — Other Ambulatory Visit: Payer: Self-pay | Admitting: *Deleted

## 2015-01-17 ENCOUNTER — Other Ambulatory Visit (INDEPENDENT_AMBULATORY_CARE_PROVIDER_SITE_OTHER): Payer: Medicare Other | Admitting: *Deleted

## 2015-01-17 DIAGNOSIS — I2510 Atherosclerotic heart disease of native coronary artery without angina pectoris: Secondary | ICD-10-CM | POA: Diagnosis not present

## 2015-01-17 DIAGNOSIS — E782 Mixed hyperlipidemia: Secondary | ICD-10-CM

## 2015-01-17 LAB — LIPID PANEL
CHOLESTEROL: 172 mg/dL (ref 0–200)
HDL: 83 mg/dL (ref >39.00)
LDL Cholesterol: 77 mg/dL (ref 0–99)
NonHDL: 89.02
TRIGLYCERIDES: 60 mg/dL (ref 0.0–149.0)
Total CHOL/HDL Ratio: 2
VLDL: 12 mg/dL (ref 0.0–40.0)

## 2015-01-17 LAB — HEPATIC FUNCTION PANEL
ALBUMIN: 4.1 g/dL (ref 3.5–5.2)
ALK PHOS: 39 U/L (ref 39–117)
ALT: 14 U/L (ref 0–53)
AST: 19 U/L (ref 0–37)
Bilirubin, Direct: 0.1 mg/dL (ref 0.0–0.3)
Total Bilirubin: 0.6 mg/dL (ref 0.2–1.2)
Total Protein: 7 g/dL (ref 6.0–8.3)

## 2015-01-17 NOTE — Addendum Note (Signed)
Addended by: Eulis Foster on: 01/17/2015 07:38 AM   Modules accepted: Orders

## 2015-04-29 DIAGNOSIS — R351 Nocturia: Secondary | ICD-10-CM | POA: Diagnosis not present

## 2015-04-29 DIAGNOSIS — R3912 Poor urinary stream: Secondary | ICD-10-CM | POA: Diagnosis not present

## 2015-04-29 DIAGNOSIS — N401 Enlarged prostate with lower urinary tract symptoms: Secondary | ICD-10-CM | POA: Diagnosis not present

## 2015-05-22 DIAGNOSIS — H26492 Other secondary cataract, left eye: Secondary | ICD-10-CM | POA: Diagnosis not present

## 2015-05-22 DIAGNOSIS — H179 Unspecified corneal scar and opacity: Secondary | ICD-10-CM | POA: Diagnosis not present

## 2015-05-31 DIAGNOSIS — E871 Hypo-osmolality and hyponatremia: Secondary | ICD-10-CM | POA: Diagnosis not present

## 2015-05-31 DIAGNOSIS — D510 Vitamin B12 deficiency anemia due to intrinsic factor deficiency: Secondary | ICD-10-CM | POA: Diagnosis not present

## 2015-05-31 DIAGNOSIS — Z23 Encounter for immunization: Secondary | ICD-10-CM | POA: Diagnosis not present

## 2015-05-31 DIAGNOSIS — Z1389 Encounter for screening for other disorder: Secondary | ICD-10-CM | POA: Diagnosis not present

## 2015-05-31 DIAGNOSIS — E7800 Pure hypercholesterolemia, unspecified: Secondary | ICD-10-CM | POA: Diagnosis not present

## 2015-05-31 DIAGNOSIS — J309 Allergic rhinitis, unspecified: Secondary | ICD-10-CM | POA: Diagnosis not present

## 2015-05-31 DIAGNOSIS — Z0001 Encounter for general adult medical examination with abnormal findings: Secondary | ICD-10-CM | POA: Diagnosis not present

## 2015-05-31 DIAGNOSIS — E559 Vitamin D deficiency, unspecified: Secondary | ICD-10-CM | POA: Diagnosis not present

## 2015-06-18 ENCOUNTER — Telehealth: Payer: Self-pay | Admitting: Interventional Cardiology

## 2015-06-18 NOTE — Telephone Encounter (Signed)
°*  STAT* If patient is at the pharmacy, call can be transferred to refill team.   1. Which medications need to be refilled? (please list name of each medication and dose if known)Praulent 75mg    2. Which pharmacy/location (including street and city if local pharmacy) is medication to be sent to?[facility]  3. Do they need a 30 day or 90 day supply? 30day

## 2015-06-20 ENCOUNTER — Other Ambulatory Visit: Payer: Self-pay | Admitting: Pharmacist

## 2015-06-20 DIAGNOSIS — E785 Hyperlipidemia, unspecified: Secondary | ICD-10-CM

## 2015-06-20 DIAGNOSIS — Z880 Allergy status to penicillin: Secondary | ICD-10-CM | POA: Diagnosis not present

## 2015-06-20 DIAGNOSIS — J301 Allergic rhinitis due to pollen: Secondary | ICD-10-CM | POA: Diagnosis not present

## 2015-06-20 NOTE — Telephone Encounter (Signed)
Pt brought in his paperwork stating he needed a new PA prior to getting his next Praulent injection.  His new insurance information is listed below:   [facility] ID # [id number] RxBin- [id number]  Will start PA process today and send in new Rx once approved.

## 2015-06-25 ENCOUNTER — Other Ambulatory Visit: Payer: Self-pay | Admitting: Pharmacist

## 2015-06-25 DIAGNOSIS — E785 Hyperlipidemia, unspecified: Secondary | ICD-10-CM

## 2015-06-25 MED ORDER — ALIROCUMAB 75 MG/ML ~~LOC~~ SOPN: 1.0000 "pen " | PEN_INJECTOR | SUBCUTANEOUS | Status: DC

## 2015-06-27 ENCOUNTER — Telehealth: Payer: Self-pay | Admitting: Interventional Cardiology

## 2015-06-27 NOTE — Telephone Encounter (Signed)
LMOM for pt to return call back. 

## 2015-06-27 NOTE — Telephone Encounter (Signed)
Completed PA via telephone for [place]. Approved through 7/19/20017. Reference #: [id number]. Pt will call [phone] to set up delivery of medication. $100 monthly copay, pt is aware.

## 2015-06-27 NOTE — Telephone Encounter (Signed)
New message      Calling to regarding prior authorization on his praluent.  There is more info number needed in order to get medication approved.  Please call today

## 2015-07-15 DIAGNOSIS — Z85828 Personal history of other malignant neoplasm of skin: Secondary | ICD-10-CM | POA: Diagnosis not present

## 2015-07-15 DIAGNOSIS — L570 Actinic keratosis: Secondary | ICD-10-CM | POA: Diagnosis not present

## 2015-07-15 DIAGNOSIS — Z85820 Personal history of malignant melanoma of skin: Secondary | ICD-10-CM | POA: Diagnosis not present

## 2015-07-15 DIAGNOSIS — D1801 Hemangioma of skin and subcutaneous tissue: Secondary | ICD-10-CM | POA: Diagnosis not present

## 2015-07-15 DIAGNOSIS — L821 Other seborrheic keratosis: Secondary | ICD-10-CM | POA: Diagnosis not present

## 2015-07-15 DIAGNOSIS — L723 Sebaceous cyst: Secondary | ICD-10-CM | POA: Diagnosis not present

## 2015-09-13 DIAGNOSIS — H6013 Cellulitis of external ear, bilateral: Secondary | ICD-10-CM | POA: Diagnosis not present

## 2015-09-13 DIAGNOSIS — H9203 Otalgia, bilateral: Secondary | ICD-10-CM | POA: Diagnosis not present

## 2015-09-13 DIAGNOSIS — H9123 Sudden idiopathic hearing loss, bilateral: Secondary | ICD-10-CM | POA: Diagnosis not present

## 2015-10-03 DIAGNOSIS — L820 Inflamed seborrheic keratosis: Secondary | ICD-10-CM | POA: Diagnosis not present

## 2015-10-03 DIAGNOSIS — Z85820 Personal history of malignant melanoma of skin: Secondary | ICD-10-CM | POA: Diagnosis not present

## 2015-10-03 DIAGNOSIS — L821 Other seborrheic keratosis: Secondary | ICD-10-CM | POA: Diagnosis not present

## 2015-10-03 DIAGNOSIS — Z85828 Personal history of other malignant neoplasm of skin: Secondary | ICD-10-CM | POA: Diagnosis not present

## 2015-10-03 DIAGNOSIS — D1801 Hemangioma of skin and subcutaneous tissue: Secondary | ICD-10-CM | POA: Diagnosis not present

## 2016-01-09 ENCOUNTER — Telehealth: Payer: Self-pay | Admitting: Interventional Cardiology

## 2016-01-09 DIAGNOSIS — E785 Hyperlipidemia, unspecified: Secondary | ICD-10-CM

## 2016-01-09 NOTE — Telephone Encounter (Signed)
New meassag    *STAT* If patient is at the pharmacy, call can be transferred to refill team.   1. Which medications need to be refilled? (please list name of each medication and dose if known) Pradulent injection 75 mg   2. Which pharmacy/location (including street and city if local pharmacy) is medication to be sent to?[facility]  3. Do they need a 30 day or 90 day supply? 30 day    The pt gets it through the mail, very costly drug   The pt needs the nurse when the insurance calls respond to insurance quickly to re-write the prescription

## 2016-01-09 NOTE — Telephone Encounter (Signed)
Please advise 

## 2016-01-09 NOTE — Telephone Encounter (Signed)
Reviewed pt's chart.  He has refills at the pharmacy but his PA expired on 12/25/15.  Will start paperwork for reauthorization.

## 2016-01-13 ENCOUNTER — Other Ambulatory Visit (INDEPENDENT_AMBULATORY_CARE_PROVIDER_SITE_OTHER): Payer: Medicare Other

## 2016-01-13 DIAGNOSIS — I2510 Atherosclerotic heart disease of native coronary artery without angina pectoris: Secondary | ICD-10-CM | POA: Diagnosis not present

## 2016-01-13 DIAGNOSIS — E782 Mixed hyperlipidemia: Secondary | ICD-10-CM

## 2016-01-14 LAB — HEPATIC FUNCTION PANEL
ALT: 12 U/L (ref 9–46)
AST: 17 U/L (ref 10–35)
Albumin: 4 g/dL (ref 3.6–5.1)
Alkaline Phosphatase: 44 U/L (ref 40–115)
BILIRUBIN INDIRECT: 0.4 mg/dL (ref 0.2–1.2)
Bilirubin, Direct: 0.1 mg/dL (ref <=0.2)
TOTAL PROTEIN: 6.4 g/dL (ref 6.1–8.1)
Total Bilirubin: 0.5 mg/dL (ref 0.2–1.2)

## 2016-01-14 LAB — LIPID PANEL
CHOL/HDL RATIO: 1.8 ratio (ref <=5.0)
Cholesterol: 149 mg/dL (ref 125–200)
HDL: 81 mg/dL (ref >=40)
LDL CALC: 44 mg/dL (ref <130)
TRIGLYCERIDES: 121 mg/dL (ref <150)
VLDL: 24 mg/dL (ref <30)

## 2016-01-16 ENCOUNTER — Other Ambulatory Visit: Payer: Self-pay | Admitting: Urology

## 2016-01-20 NOTE — Telephone Encounter (Signed)
PA approved until July 2018.

## 2016-01-28 DIAGNOSIS — L570 Actinic keratosis: Secondary | ICD-10-CM | POA: Diagnosis not present

## 2016-01-28 DIAGNOSIS — D692 Other nonthrombocytopenic purpura: Secondary | ICD-10-CM | POA: Diagnosis not present

## 2016-01-28 DIAGNOSIS — L738 Other specified follicular disorders: Secondary | ICD-10-CM | POA: Diagnosis not present

## 2016-01-28 DIAGNOSIS — L720 Epidermal cyst: Secondary | ICD-10-CM | POA: Diagnosis not present

## 2016-01-28 DIAGNOSIS — L812 Freckles: Secondary | ICD-10-CM | POA: Diagnosis not present

## 2016-01-28 DIAGNOSIS — Z85820 Personal history of malignant melanoma of skin: Secondary | ICD-10-CM | POA: Diagnosis not present

## 2016-01-28 DIAGNOSIS — D1801 Hemangioma of skin and subcutaneous tissue: Secondary | ICD-10-CM | POA: Diagnosis not present

## 2016-01-28 DIAGNOSIS — Z85828 Personal history of other malignant neoplasm of skin: Secondary | ICD-10-CM | POA: Diagnosis not present

## 2016-01-28 DIAGNOSIS — L821 Other seborrheic keratosis: Secondary | ICD-10-CM | POA: Diagnosis not present

## 2016-01-30 DIAGNOSIS — N401 Enlarged prostate with lower urinary tract symptoms: Secondary | ICD-10-CM | POA: Diagnosis not present

## 2016-01-30 DIAGNOSIS — R351 Nocturia: Secondary | ICD-10-CM | POA: Diagnosis not present

## 2016-01-30 DIAGNOSIS — R3912 Poor urinary stream: Secondary | ICD-10-CM | POA: Diagnosis not present

## 2016-03-17 ENCOUNTER — Encounter (HOSPITAL_COMMUNITY): Payer: Self-pay

## 2016-03-20 DIAGNOSIS — H6121 Impacted cerumen, right ear: Secondary | ICD-10-CM | POA: Diagnosis not present

## 2016-03-20 DIAGNOSIS — H60331 Swimmer's ear, right ear: Secondary | ICD-10-CM | POA: Diagnosis not present

## 2016-03-23 ENCOUNTER — Encounter (HOSPITAL_COMMUNITY)
Admission: RE | Admit: 2016-03-23 | Discharge: 2016-03-23 | Disposition: A | Discharge status: Home / Self Care | Payer: Medicare Other | Source: Ambulatory Visit | Attending: Urology | Admitting: Urology

## 2016-03-23 ENCOUNTER — Encounter (HOSPITAL_COMMUNITY): Payer: Self-pay

## 2016-03-23 DIAGNOSIS — Z01812 Encounter for preprocedural laboratory examination: Secondary | ICD-10-CM | POA: Insufficient documentation

## 2016-03-23 DIAGNOSIS — Z01810 Encounter for preprocedural cardiovascular examination: Secondary | ICD-10-CM | POA: Diagnosis not present

## 2016-03-23 HISTORY — DX: Frequency of micturition: R35.0

## 2016-03-23 HISTORY — DX: Pneumonia, unspecified organism: J18.9

## 2016-03-23 HISTORY — DX: Personal history of other diseases of the respiratory system: Z87.09

## 2016-03-23 LAB — BASIC METABOLIC PANEL
ANION GAP: 6 (ref 5–15)
BUN: 16 mg/dL (ref 6–20)
CALCIUM: 9.4 mg/dL (ref 8.9–10.3)
CO2: 29 mmol/L (ref 22–32)
CREATININE: 1.02 mg/dL (ref 0.61–1.24)
Chloride: 98 mmol/L — ABNORMAL LOW (ref 101–111)
GFR calc Af Amer: >60 mL/min (ref >60)
Glucose, Bld: 86 mg/dL (ref 65–99)
Potassium: 4.5 mmol/L (ref 3.5–5.1)
Sodium: 133 mmol/L — ABNORMAL LOW (ref 135–145)

## 2016-03-23 LAB — CBC
HCT: 34.2 % — ABNORMAL LOW (ref 39.0–52.0)
HEMOGLOBIN: 12.1 g/dL — AB (ref 13.0–17.0)
MCH: 34.2 pg — ABNORMAL HIGH (ref 26.0–34.0)
MCHC: 35.4 g/dL (ref 30.0–36.0)
MCV: 96.6 fL (ref 78.0–100.0)
PLATELETS: 156 10*3/uL (ref 150–400)
RBC: 3.54 MIL/uL — AB (ref 4.22–5.81)
RDW: 12.3 % (ref 11.5–15.5)
WBC: 4 10*3/uL (ref 4.0–10.5)

## 2016-03-23 NOTE — Patient Instructions (Addendum)
BENJY SANDLES  03/23/2016   Your procedure is scheduled on: Thursday April 02, 2016  Report to [facility] Main  Entrance take [place]  elevators to 3rd floor to  [place] at 5:30 AM.  Call this number if you have problems the morning of surgery [phone]   Remember: ONLY 1 PERSON MAY GO WITH YOU TO SHORT STAY TO GET  READY MORNING OF [place].  Do not eat food or drink liquids :After Midnight.     Take these medicines the morning of surgery with A SIP OF WATER: Fexofenadine (Allegra);  Valacyclovir (Valtrex);                                 You may not have any metal on your body including hair pins and              piercings  Do not wear jewelry,  lotions, powders or colognes, deodorant                   Men may shave face and neck.   Do not bring valuables to the hospital. [place].  Contacts, dentures or bridgework may not be worn into surgery.      Patients discharged the day of surgery will not be allowed to drive home.  Name and phone number of your driver:Katherine Goh (wife)  _____________________________________________________________________             [facility] - Preparing for Surgery Before surgery, you can play an important role.  Because skin is not sterile, your skin needs to be as free of germs as possible.  You can reduce the number of germs on your skin by washing with CHG (chlorahexidine gluconate) soap before surgery.  CHG is an antiseptic cleaner which kills germs and bonds with the skin to continue killing germs even after washing. Please DO NOT use if you have an allergy to CHG or antibacterial soaps.  If your skin becomes reddened/irritated stop using the CHG and inform your nurse when you arrive at Short Stay. Do not shave (including legs and underarms) for at least 48 hours prior to the first CHG shower.  You may shave your face/neck. Please follow these  instructions carefully:  1.  Shower with CHG Soap the night before surgery and the  morning of Surgery.  2.  If you choose to wash your hair, wash your hair first as usual with your  normal  shampoo.  3.  After you shampoo, rinse your hair and body thoroughly to remove the  shampoo.                           4.  Use CHG as you would any other liquid soap.  You can apply chg directly  to the skin and wash                       Gently with a scrungie or clean washcloth.  5.  Apply the CHG Soap to your body ONLY FROM THE NECK DOWN.   Do not use on face/ open  Wound or open sores. Avoid contact with eyes, ears mouth and genitals (private parts).                       Wash face,  Genitals (private parts) with your normal soap.             6.  Wash thoroughly, paying special attention to the area where your surgery  will be performed.  7.  Thoroughly rinse your body with warm water from the neck down.  8.  DO NOT shower/wash with your normal soap after using and rinsing off  the CHG Soap.                9.  Pat yourself dry with a clean towel.            10.  Wear clean pajamas.            11.  Place clean sheets on your bed the night of your first shower and do not  sleep with pets. Day of Surgery : Do not apply any lotions/deodorants the morning of surgery.  Please wear clean clothes to the hospital/surgery center.  FAILURE TO FOLLOW THESE INSTRUCTIONS MAY RESULT IN THE CANCELLATION OF YOUR SURGERY PATIENT SIGNATURE_________________________________  NURSE SIGNATURE__________________________________  ________________________________________________________________________

## 2016-03-25 ENCOUNTER — Encounter (HOSPITAL_COMMUNITY): Payer: Medicare Other

## 2016-03-25 DIAGNOSIS — Z23 Encounter for immunization: Secondary | ICD-10-CM | POA: Diagnosis not present

## 2016-03-26 NOTE — Progress Notes (Signed)
Dr Hubbard Robinson reviewed pts H&P;EKGs/epic from 03/23/2016 and 11/14/2013. No orders given. Anesthesia to see pt day of surgery.

## 2016-04-01 NOTE — H&P (Signed)
CC: BPH  HPI: Charles Grant is a 82 year-old male established patient who is here for follow up regarding further evaluation of BPH and lower urinary tract symptoms.    Ronalee Belts returns today in f/u. He has BPH with BOO and remains on Rapaflo but is scheduled for a Urolift procedure. He continues to have nocturia and a reduced stream. He had a 42ml prostate on [place] in 1/15 and had a very small middle lobe that wasn't apparent on cystoscopy. He had a 4cm prostate with bilobar obstruction. His PF then was 30ml/sec.       ALLERGIES: Penicillins    MEDICATIONS: Aspirin 81 mg tablet, chewable  Rapaflo 8 mg capsule TAKE ONE CAPSULE EACH DAY  Allegra Allergy  Flonase Allergy Relief  Pravulant  Prozac 40 mg capsule     GU PSH: Repair Sperm Duct - 09/12/2012 Vasectomy - 2006-09-13      [place] Notes: Surgery Vas Deferens Vasovasostomy Bilateral, CABG (CABG), Rotator Cuff Repair, Surgery Of Male Genitalia Vasectomy   NON-GU PSH: Coronary Artery Bypass Grafting (cabg) - 09-12-2012    GU PMH: BPH w/LUTS, Benign prostatic hyperplasia with urinary obstruction - 04/29/2015 Nocturia, Nocturia - 04/29/2015 Weak Urinary Stream, Weak urinary stream - 04/29/2015 Urinary Frequency, Increased urinary frequency - September 13, 2014 ED, arterial insufficiency, Erectile dysfunction due to arterial insufficiency - 09/12/12      PMH Notes:  2013-01-25 08:38:14 - Note: Coronary Artery Disease   NON-GU PMH: Encounter for general adult medical examination without abnormal findings, Encounter for preventive health examination - 09/13/14 Other fatigue, Fatigue - 09/12/2012 Decreased libido, Decreased libido - 2012/09/12 Personal history of other endocrine, nutritional and metabolic disease, History of hypercholesterolemia - Sep 12, 2012 Personal history of other mental and behavioral disorders, History of depression - 09/12/12    FAMILY HISTORY: Death of family member - R[place] In [place] Status Number - Runs In Family pancreatic cancer - Runs In Family    SOCIAL HISTORY: None    Notes: Never smoker, Alcohol Use, Tobacco Use, Occupation:, Caffeine Use, Marital History - Currently Married   REVIEW OF SYSTEMS:    GU Review Male:   Patient reports frequent urination, hard to postpone urination, get up at night to urinate, and trouble starting your stream. Patient denies burning/ pain with urination, leakage of urine, stream starts and stops, have to strain to urinate , erection problems, and penile pain.  Gastrointestinal (Upper):   Patient denies nausea, vomiting, and indigestion/ heartburn.  Gastrointestinal (Lower):   Patient denies constipation and diarrhea.  Constitutional:   Patient denies fever, night sweats, weight loss, and fatigue.  Skin:   Patient denies skin rash/ lesion and itching.  Eyes:   Patient denies blurred vision and double vision.  Ears/ Nose/ Throat:   Patient denies sore throat and sinus problems.  Hematologic/Lymphatic:   Patient denies swollen glands and easy bruising.  Cardiovascular:   Patient denies leg swelling and chest pains.  Respiratory:   Patient denies cough and shortness of breath.  Endocrine:   Patient denies excessive thirst.  Musculoskeletal:   Patient denies back pain and joint pain.  Neurological:   Patient denies headaches and dizziness.  Psychologic:   Patient denies depression and anxiety.   VITAL SIGNS:      01/30/2016 02:12 PM  Weight 155 lb / 70.31 kg  BP 127/79 mmHg  Heart Rate 64 /min   MULTI-SYSTEM PHYSICAL EXAMINATION:    Constitutional: Well-nourished. No physical deformities. Normally developed. Good grooming.   Respiratory: No labored breathing,  no use of accessory muscles. CTA  Cardiovascular: Normal temperature, no swelling, RRR without murmur     PAST DATA REVIEWED:  Source Of History:  Patient  Urine Test Review:   Urinalysis   02/19/10 02/04/09 01/31/08 04/04/07 11/11/05 11/11/04 11/19/03 12/05/02  PSA  Total PSA 2.98  2.51  2.46  2.19  1.70  1.37  1.17  1.48      02/19/10  Hormones  Testosterone, Total 418.0     PROCEDURES:          Urinalysis - 81003 Dipstick Dipstick Cont'd  Specimen: Voided Bilirubin: Neg  Color: Yellow Ketones: Neg  Appearance: Clear Blood: Neg  Specific Gravity: <= 1.005 Protein: Neg  pH: 6.0 Urobilinogen: 0.2  Glucose: Neg Nitrites: Neg    Leukocyte Esterase: Neg    ASSESSMENT:      ICD-10 Details  1 GU:   BPH w/LUTS - N40.1 Stable - He has stable symptoms on Rapaflo and remains interested in the Urolift.   2   Nocturia - R35.1   3   Weak Urinary Stream - R39.12    PLAN:           Schedule Return Visit: Keep Scheduled Appointment          Document Letter(s):  Created for Patient: Clinical Summary         Notes:   He is scheduled for a Urolift in late October.

## 2016-04-02 ENCOUNTER — Encounter (HOSPITAL_COMMUNITY)
Admission: RE | Disposition: A | Discharge status: Home / Self Care | Payer: Self-pay | Source: Ambulatory Visit | Attending: Urology

## 2016-04-02 ENCOUNTER — Ambulatory Visit (HOSPITAL_COMMUNITY)
Admission: RE | Admit: 2016-04-02 | Discharge: 2016-04-02 | Disposition: A | Discharge status: Home / Self Care | Payer: Medicare Other | Source: Ambulatory Visit | Attending: Urology | Admitting: Urology

## 2016-04-02 ENCOUNTER — Ambulatory Visit (HOSPITAL_COMMUNITY): Payer: Medicare Other | Admitting: Anesthesiology

## 2016-04-02 ENCOUNTER — Encounter (HOSPITAL_COMMUNITY): Payer: Self-pay | Admitting: *Deleted

## 2016-04-02 DIAGNOSIS — N138 Other obstructive and reflux uropathy: Secondary | ICD-10-CM | POA: Insufficient documentation

## 2016-04-02 DIAGNOSIS — Z7982 Long term (current) use of aspirin: Secondary | ICD-10-CM | POA: Insufficient documentation

## 2016-04-02 DIAGNOSIS — N401 Enlarged prostate with lower urinary tract symptoms: Secondary | ICD-10-CM | POA: Insufficient documentation

## 2016-04-02 DIAGNOSIS — N320 Bladder-neck obstruction: Secondary | ICD-10-CM | POA: Insufficient documentation

## 2016-04-02 DIAGNOSIS — I2510 Atherosclerotic heart disease of native coronary artery without angina pectoris: Secondary | ICD-10-CM | POA: Insufficient documentation

## 2016-04-02 DIAGNOSIS — Z880 Allergy status to penicillin: Secondary | ICD-10-CM | POA: Insufficient documentation

## 2016-04-02 DIAGNOSIS — R3912 Poor urinary stream: Secondary | ICD-10-CM | POA: Insufficient documentation

## 2016-04-02 DIAGNOSIS — Z951 Presence of aortocoronary bypass graft: Secondary | ICD-10-CM | POA: Insufficient documentation

## 2016-04-02 DIAGNOSIS — D649 Anemia, unspecified: Secondary | ICD-10-CM | POA: Diagnosis not present

## 2016-04-02 DIAGNOSIS — F329 Major depressive disorder, single episode, unspecified: Secondary | ICD-10-CM | POA: Diagnosis not present

## 2016-04-02 DIAGNOSIS — J189 Pneumonia, unspecified organism: Secondary | ICD-10-CM | POA: Diagnosis not present

## 2016-04-02 HISTORY — PX: CYSTOSCOPY WITH INSERTION OF UROLIFT: SHX6678

## 2016-04-02 SURGERY — CYSTOSCOPY WITH INSERTION OF UROLIFT
Anesthesia: General

## 2016-04-02 MED ORDER — ONDANSETRON HCL 4 MG/2ML IJ SOLN
INTRAMUSCULAR | Status: DC | PRN
  Administered 2016-04-02: 4 mg via INTRAVENOUS

## 2016-04-02 MED ORDER — PROPOFOL 10 MG/ML IV BOLUS
INTRAVENOUS | Status: AC
  Filled 2016-04-02: qty 20

## 2016-04-02 MED ORDER — PROPOFOL 10 MG/ML IV BOLUS
INTRAVENOUS | Status: DC | PRN
  Administered 2016-04-02: 140 mg via INTRAVENOUS

## 2016-04-02 MED ORDER — FENTANYL CITRATE (PF) 100 MCG/2ML IJ SOLN
INTRAMUSCULAR | Status: AC
  Filled 2016-04-02: qty 2

## 2016-04-02 MED ORDER — FENTANYL CITRATE (PF) 100 MCG/2ML IJ SOLN
25.0000 ug | INTRAMUSCULAR | Status: DC | PRN
  Administered 2016-04-02 (×2): 25 ug via INTRAVENOUS

## 2016-04-02 MED ORDER — CIPROFLOXACIN IN D5W 400 MG/200ML IV SOLN
400.0000 mg | INTRAVENOUS | Status: AC
  Administered 2016-04-02: 400 mg via INTRAVENOUS

## 2016-04-02 MED ORDER — LIDOCAINE 2% (20 MG/ML) 5 ML SYRINGE
INTRAMUSCULAR | Status: AC
Start: 2016-04-02 — End: 2016-04-02
  Filled 2016-04-02: qty 5

## 2016-04-02 MED ORDER — ONDANSETRON HCL 4 MG/2ML IJ SOLN
INTRAMUSCULAR | Status: AC
  Filled 2016-04-02: qty 2

## 2016-04-02 MED ORDER — LIDOCAINE HCL (CARDIAC) 20 MG/ML IV SOLN
INTRAVENOUS | Status: DC | PRN
  Administered 2016-04-02: 50 mg via INTRAVENOUS

## 2016-04-02 MED ORDER — PROMETHAZINE HCL 25 MG/ML IJ SOLN: 6.2500 mg | INTRAMUSCULAR | Status: DC | PRN

## 2016-04-02 MED ORDER — TRAMADOL-ACETAMINOPHEN 37.5-325 MG PO TABS: 1.0000 | ORAL_TABLET | Freq: Four times a day (QID) | ORAL | 0 refills | Status: DC | PRN

## 2016-04-02 MED ORDER — CIPROFLOXACIN HCL 250 MG PO TABS: 250.0000 mg | ORAL_TABLET | Freq: Two times a day (BID) | ORAL | 0 refills | Status: AC

## 2016-04-02 MED ORDER — SODIUM CHLORIDE 0.9 % IR SOLN
Status: DC | PRN
  Administered 2016-04-02: 6000 mL via INTRAVESICAL

## 2016-04-02 MED ORDER — FENTANYL CITRATE (PF) 100 MCG/2ML IJ SOLN
INTRAMUSCULAR | Status: AC
Start: 2016-04-02 — End: 2016-04-02
  Filled 2016-04-02: qty 2

## 2016-04-02 MED ORDER — LACTATED RINGERS IV SOLN
INTRAVENOUS | Status: DC | PRN
  Administered 2016-04-02: 07:00:00 via INTRAVENOUS

## 2016-04-02 MED ORDER — FENTANYL CITRATE (PF) 100 MCG/2ML IJ SOLN
INTRAMUSCULAR | Status: DC | PRN
  Administered 2016-04-02: 50 ug via INTRAVENOUS

## 2016-04-02 MED ORDER — CIPROFLOXACIN IN D5W 400 MG/200ML IV SOLN
INTRAVENOUS | Status: AC
  Filled 2016-04-02: qty 200

## 2016-04-02 SURGICAL SUPPLY — 14 items
BAG URINE DRAINAGE (UROLOGICAL SUPPLIES) ×3 IMPLANT
BAG URO CATCHER STRL LF (MISCELLANEOUS) ×3 IMPLANT
CATH FOLEY 2WAY SLVR  5CC 16FR (CATHETERS) ×2
CATH FOLEY 2WAY SLVR 5CC 16FR (CATHETERS) ×1 IMPLANT
CLOTH BEACON ORANGE TIMEOUT ST (SAFETY) ×3 IMPLANT
GLOVE SURG SS PI 8.0 STRL IVOR (GLOVE) ×3 IMPLANT
GOWN STRL REUS W/TWL XL LVL3 (GOWN DISPOSABLE) ×3 IMPLANT
HOLDER FOLEY CATH W/STRAP (MISCELLANEOUS) ×3 IMPLANT
MANIFOLD NEPTUNE II (INSTRUMENTS) ×3 IMPLANT
PACK CYSTO (CUSTOM PROCEDURE TRAY) ×3 IMPLANT
SYSTEM UROLIFT (Male Continence) ×15 IMPLANT
TUBING CONNECTING 10 (TUBING) ×2 IMPLANT
TUBING CONNECTING 10' (TUBING) ×1
WATER STERILE IRR 500ML POUR (IV SOLUTION) ×3 IMPLANT

## 2016-04-02 NOTE — Interval H&P Note (Signed)
History and Physical Interval Note:  04/02/2016 7:21 AM  Charles Grant  has presented today for surgery, with the diagnosis of BENIGN PROSTATIC HYPERPLASIA WITH BLADDER OUTLET OBSTRUCTION  The various methods of treatment have been discussed with the patient and family. After consideration of risks, benefits and other options for treatment, the patient has consented to  Procedure(s): CYSTOSCOPY WITH INSERTION OF UROLIFT (N/A) as a surgical intervention .  The patient's history has been reviewed, patient examined, no change in status, stable for surgery.  I have reviewed the patient's chart and labs.  Questions were answered to the patient's satisfaction.     Baker Moronta J

## 2016-04-02 NOTE — Brief Op Note (Signed)
04/02/2016  8:06 AM  PATIENT:  Charles Grant  82 y.o. male  PRE-OPERATIVE DIAGNOSIS:  BENIGN PROSTATIC HYPERPLASIA WITH BLADDER OUTLET OBSTRUCTION  POST-OPERATIVE DIAGNOSIS:  BENIGN PROSTATIC HYPERPLASIA WITH BLADDER OUTLET OBSTRUCTION  PROCEDURE:  Procedure(s): CYSTOSCOPY WITH INSERTION OF UROLIFT x4 (N/A)  SURGEON:  Surgeon(s) and Role:    * Irine Seal, MD - Primary    * Cleon Gustin, MD - Assisting  PHYSICIAN ASSISTANT:   ASSISTANTS: none   ANESTHESIA:   general  EBL:  No intake/output data recorded.  BLOOD ADMINISTERED:none  DRAINS: Urinary Catheter (Foley)   LOCAL MEDICATIONS USED:  NONE  SPECIMEN:  No Specimen  DISPOSITION OF SPECIMEN:  N/A  COUNTS:  YES  TOURNIQUET:  * No tourniquets in log *  DICTATION: .Other Dictation: Dictation Number [id number]  PLAN OF CARE: Discharge to home after PACU  PATIENT DISPOSITION:  PACU - hemodynamically stable.   Delay start of Pharmacological VTE agent (>24hrs) due to surgical blood loss or risk of bleeding: not applicable

## 2016-04-02 NOTE — Anesthesia Postprocedure Evaluation (Signed)
Anesthesia Post Note  Patient: Charles Grant  Procedure(s) Performed: Procedure(s) (LRB): CYSTOSCOPY WITH INSERTION OF UROLIFT x4 (N/A)  Patient location during evaluation: PACU Anesthesia Type: General Level of consciousness: awake and alert Pain management: pain level controlled Vital Signs Assessment: post-procedure vital signs reviewed and stable Respiratory status: spontaneous breathing, nonlabored ventilation, respiratory function stable and patient connected to nasal cannula oxygen Cardiovascular status: blood pressure returned to baseline and stable Postop Assessment: no signs of nausea or vomiting Anesthetic complications: no    Last Vitals:  Vitals:   04/02/16 0921 04/02/16 1013  BP: (!) 144/71 (!) 147/72  Pulse: (!) 52 66  Resp:  16  Temp: 36.4 C 36.5 C    Last Pain:  Vitals:   04/02/16 1013  TempSrc: Oral  PainSc:                  Eliot Bencivenga J

## 2016-04-02 NOTE — Anesthesia Procedure Notes (Signed)
Procedure Name: LMA Insertion Date/Time: 04/02/2016 7:33 AM Performed by: Glory Buff Pre-anesthesia Checklist: Patient identified, Emergency Drugs available, Suction available and Patient being monitored Patient Re-evaluated:Patient Re-evaluated prior to inductionOxygen Delivery Method: Circle system utilized Preoxygenation: Pre-oxygenation with 100% oxygen Intubation Type: IV induction LMA: LMA inserted LMA Size: 4.0 Number of attempts: 1 Placement Confirmation: positive ETCO2 Tube secured with: Tape Dental Injury: Teeth and Oropharynx as per pre-operative assessment

## 2016-04-02 NOTE — Discharge Instructions (Signed)
CYSTOSCOPY HOME CARE INSTRUCTIONS  Activity: Rest for the remainder of the day.  Do not drive or operate equipment today.  You may resume normal activities in one to two days as instructed by your physician.   Meals: Drink plenty of liquids and eat light foods such as gelatin or soup this evening.  You may return to a normal meal plan tomorrow.  Return to Work: You may return to work in one to two days or as instructed by your physician.  Special Instructions / Symptoms: Call your physician if any of these symptoms occur:   -persistent or heavy bleeding  -bleeding which continues after first few urination  -large blood clots that are difficult to pass  -urine stream diminishes or stops completely  -fever equal to or higher than 101 degrees Farenheit.  -cloudy urine with a strong, foul odor  -severe pain  Females should always wipe from front to back after elimination.  You may feel some burning pain when you urinate.  This should disappear with time.  Applying moist heat to the lower abdomen or a hot tub bath may help relieve the pain. \  Please come to the office in the morning for catheter removal.   Patient Signature:  ________________________________________________________  Nurse's Signature:  ________________________________________________________

## 2016-04-02 NOTE — Transfer of Care (Signed)
Immediate Anesthesia Transfer of Care Note  Patient: Charles Grant  Procedure(s) Performed: Procedure(s): CYSTOSCOPY WITH INSERTION OF UROLIFT x4 (N/A)  Patient Location: PACU  Anesthesia Type:General  Level of Consciousness: awake, alert  and oriented  Airway & Oxygen Therapy: Patient Spontanous Breathing and Patient connected to face mask oxygen  Post-op Assessment: Report given to RN and Post -op Vital signs reviewed and stable  Post vital signs: Reviewed and stable  Last Vitals:  Vitals:   04/02/16 0542  BP: 132/69  Pulse: (!) 105  Resp: 18  Temp: 36.8 C    Last Pain:  Vitals:   04/02/16 0542  TempSrc: Oral      Patients Stated Pain Goal: 3 ([id number])  Complications: No apparent anesthesia complications

## 2016-04-02 NOTE — Anesthesia Preprocedure Evaluation (Signed)
Anesthesia Evaluation  Patient identified by MRN, date of birth, ID band Patient awake    Reviewed: Allergy & Precautions, NPO status , Patient's Chart, lab work & pertinent test results  Airway Mallampati: II  TM Distance: >3 FB Neck ROM: Full    Dental no notable dental hx.    Pulmonary pneumonia,    Pulmonary exam normal breath sounds clear to auscultation       Cardiovascular + CAD  Normal cardiovascular exam Rhythm:Regular Rate:Normal     Neuro/Psych PSYCHIATRIC DISORDERS Depression negative neurological ROS     GI/Hepatic negative GI ROS, Neg liver ROS,   Endo/Other  negative endocrine ROS  Renal/GU negative Renal ROS  negative genitourinary   Musculoskeletal negative musculoskeletal ROS (+)   Abdominal   Peds negative pediatric ROS (+)  Hematology  (+) anemia ,   Anesthesia Other Findings   Reproductive/Obstetrics negative OB ROS                             Anesthesia Physical Anesthesia Plan  ASA: III  Anesthesia Plan: General   Post-op Pain Management:    Induction: Intravenous  Airway Management Planned: LMA  Additional Equipment:   Intra-op Plan:   Post-operative Plan: Extubation in OR  Informed Consent: I have reviewed the patients History and Physical, chart, labs and discussed the procedure including the risks, benefits and alternatives for the proposed anesthesia with the patient or authorized representative who has indicated his/her understanding and acceptance.   Dental advisory given  Plan Discussed with: CRNA  Anesthesia Plan Comments:         Anesthesia Quick Evaluation

## 2016-04-02 NOTE — Interval H&P Note (Signed)
History and Physical Interval Note:  04/02/2016 7:21 AM  Charles Grant  has presented today for surgery, with the diagnosis of BENIGN PROSTATIC HYPERPLASIA WITH BLADDER OUTLET OBSTRUCTION  The various methods of treatment have been discussed with the patient and family. After consideration of risks, benefits and other options for treatment, the patient has consented to  Procedure(s): CYSTOSCOPY WITH INSERTION OF UROLIFT (N/A) as a surgical intervention .  The patient's history has been reviewed, patient examined, no change in status, stable for surgery.  I have reviewed the patient's chart and labs.  Questions were answered to the patient's satisfaction.     [name] J

## 2016-04-03 DIAGNOSIS — R3912 Poor urinary stream: Secondary | ICD-10-CM | POA: Diagnosis not present

## 2016-04-03 NOTE — Op Note (Signed)
NAME:  Charles Grant, Charles Grant               ACCOUNT NO.:  [id number]  MEDICAL RECORD NO.:  [id number]  LOCATION:  [facility]                         FACILITY:  [facility]  PHYSICIAN:  Marshall Cork. Jeffie Pollock, M.D.    DATE OF BIRTH:  25-Feb-1934  DATE OF PROCEDURE:  04/02/2016 DATE OF DISCHARGE:  04/02/2016                              OPERATIVE REPORT   PROCEDURE:  Cystoscopy with prostatic UroLift.  PREOPERATIVE DIAGNOSIS:  Benign prostatic hypertrophy with bladder outlet obstruction.  POSTOPERATIVE DIAGNOSIS:  Benign prostatic hypertrophy with bladder outlet obstruction.  SURGEON:  Marshall Cork. Jeffie Pollock, M.D.  ANESTHESIA:  General.  SPECIMEN:  None.  DRAINS:  A 16-French Foley catheter.  BLOOD LOSS:  None.  COMPLICATIONS:  None.  INDICATIONS:  Mr. Heavey is an 82 year old white male with BPH and bladder outlet obstruction, who has elected UroLift for symptom relief.  FINDINGS AND PROCEDURES:  He was taken to the operating room where he was given Cipro.  A general anesthetic was induced.  He was placed in lithotomy position and was fitted with PAS hose.  His perineum and genitalia were prepped with Betadine solution, he was draped in usual sterile fashion.  Cystoscopy was performed with the UroLift sheath and the 0 degree lens. Examination revealed a normal urethra.  The external sphincter was intact.  The prostatic urethra had trilobar hyperplasia, but the middle lobe was small.  Prostatic urethra was approximately 3 cm in length. Examination of the bladder revealed moderate trabeculation.  No tumors, stones or inflammation were noted.  Ureteral orifices were unremarkable.  After initial inspection, the first UroLift device was passed.  This was placed at 2 o'clock, 1.5 cm distal to the bladder neck.  Compression was applied.  The device was fired and the suture was cut and there was excellent tacking of the lateral lobe.  This was then repeated at 10 o'clock 1.5 cm proximal to the bladder neck once  again with a good deployment.  The third device was inserted and the scope was brought back to the veru and then advanced to a cm.  The device was fired at 2 o'clock.  This particular device misfired and was removed.  It was then replaced and refired in the same position with a good deployment and compression of the lateral lobe.  A fourth device was then deployed at 10 o'clock, 1 cm proximal to the veru once again with good deployment.  After completion of deployment of four UroLift devices, inspection revealed an excellent anterior channel.  The scope was then removed. Pressure on the bladder produced a stream.  A 16-French Foley catheter was inserted.  The balloon was filled with 10 mL of sterile fluid and the catheter was placed to straight drainage.  The patient was taken down from lithotomy position.  His anesthetic was reversed.  He was moved to the recovery room in stable condition.  There were no complications.     Marshall Cork. Jeffie Pollock, M.D.     JJW/MEDQ  D:  04/02/2016  T:  04/03/2016  Job:  [id number]

## 2016-04-20 DIAGNOSIS — R351 Nocturia: Secondary | ICD-10-CM | POA: Diagnosis not present

## 2016-04-20 DIAGNOSIS — N401 Enlarged prostate with lower urinary tract symptoms: Secondary | ICD-10-CM | POA: Diagnosis not present

## 2016-05-15 DIAGNOSIS — D510 Vitamin B12 deficiency anemia due to intrinsic factor deficiency: Secondary | ICD-10-CM | POA: Diagnosis not present

## 2016-05-15 DIAGNOSIS — N400 Enlarged prostate without lower urinary tract symptoms: Secondary | ICD-10-CM | POA: Diagnosis not present

## 2016-05-15 DIAGNOSIS — I25119 Atherosclerotic heart disease of native coronary artery with unspecified angina pectoris: Secondary | ICD-10-CM | POA: Diagnosis not present

## 2016-05-15 DIAGNOSIS — F339 Major depressive disorder, recurrent, unspecified: Secondary | ICD-10-CM | POA: Diagnosis not present

## 2016-06-09 ENCOUNTER — Other Ambulatory Visit: Payer: Self-pay | Admitting: Pharmacist

## 2016-06-09 DIAGNOSIS — E785 Hyperlipidemia, unspecified: Secondary | ICD-10-CM | POA: Diagnosis not present

## 2016-06-09 DIAGNOSIS — E782 Mixed hyperlipidemia: Secondary | ICD-10-CM

## 2016-06-09 DIAGNOSIS — I2510 Atherosclerotic heart disease of native coronary artery without angina pectoris: Secondary | ICD-10-CM | POA: Diagnosis not present

## 2016-06-09 DIAGNOSIS — N401 Enlarged prostate with lower urinary tract symptoms: Secondary | ICD-10-CM | POA: Diagnosis not present

## 2016-06-09 DIAGNOSIS — J019 Acute sinusitis, unspecified: Secondary | ICD-10-CM | POA: Diagnosis not present

## 2016-06-09 MED ORDER — ALIROCUMAB 75 MG/ML ~~LOC~~ SOPN: 1.0000 "pen " | PEN_INJECTOR | SUBCUTANEOUS | 11 refills | Status: DC

## 2016-06-17 DIAGNOSIS — H179 Unspecified corneal scar and opacity: Secondary | ICD-10-CM | POA: Diagnosis not present

## 2016-06-17 DIAGNOSIS — Z961 Presence of intraocular lens: Secondary | ICD-10-CM | POA: Diagnosis not present

## 2016-07-02 DIAGNOSIS — Z880 Allergy status to penicillin: Secondary | ICD-10-CM | POA: Diagnosis not present

## 2016-07-02 DIAGNOSIS — J301 Allergic rhinitis due to pollen: Secondary | ICD-10-CM | POA: Diagnosis not present

## 2016-07-07 DIAGNOSIS — D518 Other vitamin B12 deficiency anemias: Secondary | ICD-10-CM | POA: Diagnosis not present

## 2016-07-07 DIAGNOSIS — E559 Vitamin D deficiency, unspecified: Secondary | ICD-10-CM | POA: Diagnosis not present

## 2016-07-07 DIAGNOSIS — N401 Enlarged prostate with lower urinary tract symptoms: Secondary | ICD-10-CM | POA: Diagnosis not present

## 2016-07-07 DIAGNOSIS — E782 Mixed hyperlipidemia: Secondary | ICD-10-CM | POA: Diagnosis not present

## 2016-07-13 DIAGNOSIS — Z23 Encounter for immunization: Secondary | ICD-10-CM | POA: Diagnosis not present

## 2016-07-13 DIAGNOSIS — Z0000 Encounter for general adult medical examination without abnormal findings: Secondary | ICD-10-CM | POA: Diagnosis not present

## 2016-07-13 DIAGNOSIS — N401 Enlarged prostate with lower urinary tract symptoms: Secondary | ICD-10-CM | POA: Diagnosis not present

## 2016-07-13 DIAGNOSIS — I2510 Atherosclerotic heart disease of native coronary artery without angina pectoris: Secondary | ICD-10-CM | POA: Diagnosis not present

## 2016-07-13 DIAGNOSIS — C439 Malignant melanoma of skin, unspecified: Secondary | ICD-10-CM | POA: Diagnosis not present

## 2016-07-13 DIAGNOSIS — E785 Hyperlipidemia, unspecified: Secondary | ICD-10-CM | POA: Diagnosis not present

## 2016-07-20 DIAGNOSIS — H6121 Impacted cerumen, right ear: Secondary | ICD-10-CM | POA: Diagnosis not present

## 2016-07-27 DIAGNOSIS — J301 Allergic rhinitis due to pollen: Secondary | ICD-10-CM | POA: Diagnosis not present

## 2016-07-27 DIAGNOSIS — Z880 Allergy status to penicillin: Secondary | ICD-10-CM | POA: Diagnosis not present

## 2016-07-30 DIAGNOSIS — Z85828 Personal history of other malignant neoplasm of skin: Secondary | ICD-10-CM | POA: Diagnosis not present

## 2016-07-30 DIAGNOSIS — Z85820 Personal history of malignant melanoma of skin: Secondary | ICD-10-CM | POA: Diagnosis not present

## 2016-07-30 DIAGNOSIS — D692 Other nonthrombocytopenic purpura: Secondary | ICD-10-CM | POA: Diagnosis not present

## 2016-07-30 DIAGNOSIS — L570 Actinic keratosis: Secondary | ICD-10-CM | POA: Diagnosis not present

## 2016-07-30 DIAGNOSIS — D225 Melanocytic nevi of trunk: Secondary | ICD-10-CM | POA: Diagnosis not present

## 2016-07-30 DIAGNOSIS — L821 Other seborrheic keratosis: Secondary | ICD-10-CM | POA: Diagnosis not present

## 2016-07-30 DIAGNOSIS — D1801 Hemangioma of skin and subcutaneous tissue: Secondary | ICD-10-CM | POA: Diagnosis not present

## 2016-07-30 DIAGNOSIS — L812 Freckles: Secondary | ICD-10-CM | POA: Diagnosis not present

## 2016-10-06 DIAGNOSIS — H1045 Other chronic allergic conjunctivitis: Secondary | ICD-10-CM | POA: Diagnosis not present

## 2016-10-06 DIAGNOSIS — J301 Allergic rhinitis due to pollen: Secondary | ICD-10-CM | POA: Diagnosis not present

## 2016-10-06 DIAGNOSIS — Z880 Allergy status to penicillin: Secondary | ICD-10-CM | POA: Diagnosis not present

## 2016-10-28 DIAGNOSIS — R351 Nocturia: Secondary | ICD-10-CM | POA: Diagnosis not present

## 2016-10-28 DIAGNOSIS — N401 Enlarged prostate with lower urinary tract symptoms: Secondary | ICD-10-CM | POA: Diagnosis not present

## 2016-11-11 DIAGNOSIS — Z0001 Encounter for general adult medical examination with abnormal findings: Secondary | ICD-10-CM | POA: Diagnosis not present

## 2016-11-11 DIAGNOSIS — R5382 Chronic fatigue, unspecified: Secondary | ICD-10-CM | POA: Diagnosis not present

## 2016-11-11 DIAGNOSIS — E222 Syndrome of inappropriate secretion of antidiuretic hormone: Secondary | ICD-10-CM | POA: Diagnosis not present

## 2016-11-11 DIAGNOSIS — Z1389 Encounter for screening for other disorder: Secondary | ICD-10-CM | POA: Diagnosis not present

## 2016-11-11 DIAGNOSIS — D510 Vitamin B12 deficiency anemia due to intrinsic factor deficiency: Secondary | ICD-10-CM | POA: Diagnosis not present

## 2016-11-11 DIAGNOSIS — Z79899 Other long term (current) drug therapy: Secondary | ICD-10-CM | POA: Diagnosis not present

## 2016-11-11 DIAGNOSIS — F322 Major depressive disorder, single episode, severe without psychotic features: Secondary | ICD-10-CM | POA: Diagnosis not present

## 2016-11-11 DIAGNOSIS — I209 Angina pectoris, unspecified: Secondary | ICD-10-CM | POA: Diagnosis not present

## 2016-11-11 DIAGNOSIS — I25119 Atherosclerotic heart disease of native coronary artery with unspecified angina pectoris: Secondary | ICD-10-CM | POA: Diagnosis not present

## 2017-01-29 ENCOUNTER — Telehealth: Payer: Self-pay | Admitting: Pharmacist

## 2017-01-29 NOTE — Telephone Encounter (Signed)
New Message:        Please call,pt says his insurance company have denied paying for his Pravulent.His insurance company is [place] [phone].

## 2017-01-29 NOTE — Telephone Encounter (Signed)
Returned call to pt - we are aware of denial and have submitted an appeals request. Will make pt aware once we have heard back on a decision.

## 2017-02-01 NOTE — Telephone Encounter (Signed)
Appeals overturned and Praluent has been approved for another year. Pt is aware.

## 2017-02-02 DIAGNOSIS — L720 Epidermal cyst: Secondary | ICD-10-CM | POA: Diagnosis not present

## 2017-02-02 DIAGNOSIS — Z85820 Personal history of malignant melanoma of skin: Secondary | ICD-10-CM | POA: Diagnosis not present

## 2017-02-02 DIAGNOSIS — D1801 Hemangioma of skin and subcutaneous tissue: Secondary | ICD-10-CM | POA: Diagnosis not present

## 2017-02-02 DIAGNOSIS — L812 Freckles: Secondary | ICD-10-CM | POA: Diagnosis not present

## 2017-02-02 DIAGNOSIS — L821 Other seborrheic keratosis: Secondary | ICD-10-CM | POA: Diagnosis not present

## 2017-02-02 DIAGNOSIS — L570 Actinic keratosis: Secondary | ICD-10-CM | POA: Diagnosis not present

## 2017-02-02 DIAGNOSIS — Z85828 Personal history of other malignant neoplasm of skin: Secondary | ICD-10-CM | POA: Diagnosis not present

## 2017-03-17 DIAGNOSIS — Z23 Encounter for immunization: Secondary | ICD-10-CM | POA: Diagnosis not present

## 2017-06-25 ENCOUNTER — Other Ambulatory Visit: Payer: Self-pay | Admitting: Pharmacist

## 2017-06-25 DIAGNOSIS — E782 Mixed hyperlipidemia: Secondary | ICD-10-CM

## 2017-06-25 MED ORDER — ALIROCUMAB 75 MG/ML ~~LOC~~ SOPN: 1.0000 "pen " | PEN_INJECTOR | SUBCUTANEOUS | 11 refills | Status: DC

## 2017-12-22 ENCOUNTER — Other Ambulatory Visit: Payer: Self-pay | Admitting: Nurse Practitioner

## 2017-12-22 DIAGNOSIS — N6310 Unspecified lump in the right breast, unspecified quadrant: Secondary | ICD-10-CM

## 2017-12-24 ENCOUNTER — Ambulatory Visit
Admission: RE | Admit: 2017-12-24 | Discharge: 2017-12-24 | Disposition: A | Discharge status: Home / Self Care | Payer: Medicare Other | Source: Ambulatory Visit | Attending: Nurse Practitioner | Admitting: Nurse Practitioner

## 2017-12-24 ENCOUNTER — Ambulatory Visit: Payer: Medicare Other

## 2017-12-24 DIAGNOSIS — N6310 Unspecified lump in the right breast, unspecified quadrant: Secondary | ICD-10-CM

## 2018-01-10 ENCOUNTER — Other Ambulatory Visit: Payer: Medicare Other

## 2018-06-16 DIAGNOSIS — H1045 Other chronic allergic conjunctivitis: Secondary | ICD-10-CM | POA: Diagnosis not present

## 2018-06-16 DIAGNOSIS — J301 Allergic rhinitis due to pollen: Secondary | ICD-10-CM | POA: Diagnosis not present

## 2018-06-16 DIAGNOSIS — Z880 Allergy status to penicillin: Secondary | ICD-10-CM | POA: Diagnosis not present

## 2018-06-21 DIAGNOSIS — L812 Freckles: Secondary | ICD-10-CM | POA: Diagnosis not present

## 2018-06-21 DIAGNOSIS — D485 Neoplasm of uncertain behavior of skin: Secondary | ICD-10-CM | POA: Diagnosis not present

## 2018-06-21 DIAGNOSIS — L723 Sebaceous cyst: Secondary | ICD-10-CM | POA: Diagnosis not present

## 2018-06-21 DIAGNOSIS — L821 Other seborrheic keratosis: Secondary | ICD-10-CM | POA: Diagnosis not present

## 2018-06-21 DIAGNOSIS — L570 Actinic keratosis: Secondary | ICD-10-CM | POA: Diagnosis not present

## 2018-06-21 DIAGNOSIS — L720 Epidermal cyst: Secondary | ICD-10-CM | POA: Diagnosis not present

## 2018-06-21 DIAGNOSIS — Z85828 Personal history of other malignant neoplasm of skin: Secondary | ICD-10-CM | POA: Diagnosis not present

## 2018-06-21 DIAGNOSIS — D692 Other nonthrombocytopenic purpura: Secondary | ICD-10-CM | POA: Diagnosis not present

## 2018-06-21 DIAGNOSIS — Z85820 Personal history of malignant melanoma of skin: Secondary | ICD-10-CM | POA: Diagnosis not present

## 2018-06-21 DIAGNOSIS — D1801 Hemangioma of skin and subcutaneous tissue: Secondary | ICD-10-CM | POA: Diagnosis not present

## 2018-07-11 ENCOUNTER — Other Ambulatory Visit: Payer: Self-pay | Admitting: Interventional Cardiology

## 2018-07-11 NOTE — Telephone Encounter (Signed)
Pt is requesting refill for PRALUENT please address thank you.

## 2018-07-26 DIAGNOSIS — E785 Hyperlipidemia, unspecified: Secondary | ICD-10-CM | POA: Diagnosis not present

## 2018-07-26 DIAGNOSIS — I2510 Atherosclerotic heart disease of native coronary artery without angina pectoris: Secondary | ICD-10-CM | POA: Diagnosis not present

## 2018-07-26 DIAGNOSIS — Z0000 Encounter for general adult medical examination without abnormal findings: Secondary | ICD-10-CM | POA: Diagnosis not present

## 2018-07-26 DIAGNOSIS — R351 Nocturia: Secondary | ICD-10-CM | POA: Diagnosis not present

## 2018-07-26 DIAGNOSIS — E559 Vitamin D deficiency, unspecified: Secondary | ICD-10-CM | POA: Diagnosis not present

## 2018-07-26 DIAGNOSIS — E538 Deficiency of other specified B group vitamins: Secondary | ICD-10-CM | POA: Diagnosis not present

## 2018-08-02 DIAGNOSIS — R351 Nocturia: Secondary | ICD-10-CM | POA: Diagnosis not present

## 2018-08-02 DIAGNOSIS — C439 Malignant melanoma of skin, unspecified: Secondary | ICD-10-CM | POA: Diagnosis not present

## 2018-08-02 DIAGNOSIS — I2510 Atherosclerotic heart disease of native coronary artery without angina pectoris: Secondary | ICD-10-CM | POA: Diagnosis not present

## 2018-08-02 DIAGNOSIS — Z0000 Encounter for general adult medical examination without abnormal findings: Secondary | ICD-10-CM | POA: Diagnosis not present

## 2018-08-02 DIAGNOSIS — E785 Hyperlipidemia, unspecified: Secondary | ICD-10-CM | POA: Diagnosis not present

## 2018-08-02 DIAGNOSIS — H60543 Acute eczematoid otitis externa, bilateral: Secondary | ICD-10-CM | POA: Diagnosis not present

## 2018-08-02 DIAGNOSIS — H9193 Unspecified hearing loss, bilateral: Secondary | ICD-10-CM | POA: Diagnosis not present

## 2018-08-02 DIAGNOSIS — E559 Vitamin D deficiency, unspecified: Secondary | ICD-10-CM | POA: Diagnosis not present

## 2018-08-02 DIAGNOSIS — E538 Deficiency of other specified B group vitamins: Secondary | ICD-10-CM | POA: Diagnosis not present

## 2018-12-08 DIAGNOSIS — I209 Angina pectoris, unspecified: Secondary | ICD-10-CM | POA: Diagnosis not present

## 2018-12-08 DIAGNOSIS — J309 Allergic rhinitis, unspecified: Secondary | ICD-10-CM | POA: Diagnosis not present

## 2018-12-08 DIAGNOSIS — Z1211 Encounter for screening for malignant neoplasm of colon: Secondary | ICD-10-CM | POA: Diagnosis not present

## 2018-12-08 DIAGNOSIS — R5382 Chronic fatigue, unspecified: Secondary | ICD-10-CM | POA: Diagnosis not present

## 2018-12-08 DIAGNOSIS — Z0000 Encounter for general adult medical examination without abnormal findings: Secondary | ICD-10-CM | POA: Diagnosis not present

## 2018-12-08 DIAGNOSIS — E559 Vitamin D deficiency, unspecified: Secondary | ICD-10-CM | POA: Diagnosis not present

## 2018-12-08 DIAGNOSIS — F322 Major depressive disorder, single episode, severe without psychotic features: Secondary | ICD-10-CM | POA: Diagnosis not present

## 2018-12-08 DIAGNOSIS — E871 Hypo-osmolality and hyponatremia: Secondary | ICD-10-CM | POA: Diagnosis not present

## 2018-12-08 DIAGNOSIS — E222 Syndrome of inappropriate secretion of antidiuretic hormone: Secondary | ICD-10-CM | POA: Diagnosis not present

## 2018-12-08 DIAGNOSIS — N400 Enlarged prostate without lower urinary tract symptoms: Secondary | ICD-10-CM | POA: Diagnosis not present

## 2018-12-08 DIAGNOSIS — D510 Vitamin B12 deficiency anemia due to intrinsic factor deficiency: Secondary | ICD-10-CM | POA: Diagnosis not present

## 2018-12-08 DIAGNOSIS — I25119 Atherosclerotic heart disease of native coronary artery with unspecified angina pectoris: Secondary | ICD-10-CM | POA: Diagnosis not present

## 2018-12-13 DIAGNOSIS — Z85820 Personal history of malignant melanoma of skin: Secondary | ICD-10-CM | POA: Diagnosis not present

## 2018-12-13 DIAGNOSIS — L812 Freckles: Secondary | ICD-10-CM | POA: Diagnosis not present

## 2018-12-13 DIAGNOSIS — Z85828 Personal history of other malignant neoplasm of skin: Secondary | ICD-10-CM | POA: Diagnosis not present

## 2018-12-13 DIAGNOSIS — D1801 Hemangioma of skin and subcutaneous tissue: Secondary | ICD-10-CM | POA: Diagnosis not present

## 2018-12-13 DIAGNOSIS — L570 Actinic keratosis: Secondary | ICD-10-CM | POA: Diagnosis not present

## 2018-12-13 DIAGNOSIS — L720 Epidermal cyst: Secondary | ICD-10-CM | POA: Diagnosis not present

## 2018-12-13 DIAGNOSIS — L821 Other seborrheic keratosis: Secondary | ICD-10-CM | POA: Diagnosis not present

## 2018-12-28 DIAGNOSIS — S81811A Laceration without foreign body, right lower leg, initial encounter: Secondary | ICD-10-CM | POA: Diagnosis not present

## 2019-01-02 DIAGNOSIS — Z1211 Encounter for screening for malignant neoplasm of colon: Secondary | ICD-10-CM | POA: Diagnosis not present

## 2019-01-02 DIAGNOSIS — Z1212 Encounter for screening for malignant neoplasm of rectum: Secondary | ICD-10-CM | POA: Diagnosis not present

## 2019-01-12 DIAGNOSIS — Z1159 Encounter for screening for other viral diseases: Secondary | ICD-10-CM | POA: Diagnosis not present

## 2019-01-30 DIAGNOSIS — Z961 Presence of intraocular lens: Secondary | ICD-10-CM | POA: Diagnosis not present

## 2019-01-30 DIAGNOSIS — H179 Unspecified corneal scar and opacity: Secondary | ICD-10-CM | POA: Diagnosis not present

## 2019-02-01 DIAGNOSIS — L720 Epidermal cyst: Secondary | ICD-10-CM | POA: Diagnosis not present

## 2019-02-01 DIAGNOSIS — Z85820 Personal history of malignant melanoma of skin: Secondary | ICD-10-CM | POA: Diagnosis not present

## 2019-02-01 DIAGNOSIS — L718 Other rosacea: Secondary | ICD-10-CM | POA: Diagnosis not present

## 2019-02-01 DIAGNOSIS — Z85828 Personal history of other malignant neoplasm of skin: Secondary | ICD-10-CM | POA: Diagnosis not present

## 2019-02-18 DIAGNOSIS — Z23 Encounter for immunization: Secondary | ICD-10-CM | POA: Diagnosis not present

## 2019-03-20 DIAGNOSIS — H6123 Impacted cerumen, bilateral: Secondary | ICD-10-CM | POA: Diagnosis not present

## 2019-03-23 DIAGNOSIS — L720 Epidermal cyst: Secondary | ICD-10-CM | POA: Diagnosis not present

## 2019-03-23 DIAGNOSIS — Z85820 Personal history of malignant melanoma of skin: Secondary | ICD-10-CM | POA: Diagnosis not present

## 2019-03-23 DIAGNOSIS — Z85828 Personal history of other malignant neoplasm of skin: Secondary | ICD-10-CM | POA: Diagnosis not present

## 2019-03-30 DIAGNOSIS — J300 Vasomotor rhinitis: Secondary | ICD-10-CM | POA: Diagnosis not present

## 2019-03-30 DIAGNOSIS — H6993 Unspecified Eustachian tube disorder, bilateral: Secondary | ICD-10-CM | POA: Diagnosis not present

## 2019-03-30 DIAGNOSIS — H6123 Impacted cerumen, bilateral: Secondary | ICD-10-CM | POA: Diagnosis not present

## 2019-04-26 DIAGNOSIS — Z23 Encounter for immunization: Secondary | ICD-10-CM | POA: Diagnosis not present

## 2019-04-26 DIAGNOSIS — Z711 Person with feared health complaint in whom no diagnosis is made: Secondary | ICD-10-CM | POA: Diagnosis not present

## 2019-04-26 DIAGNOSIS — Z0000 Encounter for general adult medical examination without abnormal findings: Secondary | ICD-10-CM | POA: Diagnosis not present

## 2019-05-02 ENCOUNTER — Other Ambulatory Visit: Payer: Self-pay

## 2019-05-02 DIAGNOSIS — Z20822 Contact with and (suspected) exposure to covid-19: Secondary | ICD-10-CM

## 2019-05-02 DIAGNOSIS — Z20828 Contact with and (suspected) exposure to other viral communicable diseases: Secondary | ICD-10-CM | POA: Diagnosis not present

## 2019-05-04 LAB — NOVEL CORONAVIRUS, NAA: SARS-CoV-2, NAA: NOT DETECTED

## 2019-05-31 ENCOUNTER — Ambulatory Visit (INDEPENDENT_AMBULATORY_CARE_PROVIDER_SITE_OTHER): Payer: Medicare Other | Admitting: Otolaryngology

## 2019-05-31 ENCOUNTER — Other Ambulatory Visit: Payer: Self-pay

## 2019-05-31 ENCOUNTER — Encounter (INDEPENDENT_AMBULATORY_CARE_PROVIDER_SITE_OTHER): Payer: Self-pay | Admitting: Otolaryngology

## 2019-05-31 VITALS — Temp 96.1°F

## 2019-05-31 DIAGNOSIS — H6123 Impacted cerumen, bilateral: Secondary | ICD-10-CM

## 2019-05-31 NOTE — Progress Notes (Signed)
HPI: NASIR CAM is a 85 y.o. male who presents for evaluation of ear wax.  He wears bilateral hearing aids he gets from [facility].  Denies any ear pain..  Past Medical History:  Diagnosis Date  . Anemia   . Coronary artery disease 05/21/2011  . Depression   . History of bronchitis   . Pneumonia    history of   . Urinary frequency    Past Surgical History:  Procedure Laterality Date  . CARDIAC CATHETERIZATION    . CORONARY ARTERY BYPASS GRAFT  05/22/2011   Procedure: CORONARY ARTERY BYPASS GRAFTING (CABG);  Surgeon: Grace Isaac, MD;  Location: [place];  Service: Open Heart Surgery;  Laterality: N/A;  CABG x five;  using left internal mammary artery and right leg greater saphenous vein harvested endoscopically  . CYSTOSCOPY WITH INSERTION OF UROLIFT N/A 04/02/2016   Procedure: CYSTOSCOPY WITH INSERTION OF UROLIFT x4;  Surgeon: Irine Seal, MD;  Location: [facility];  Service: Urology;  Laterality: N/A;  . EYE SURGERY     bilateral cataract surgery   . right rotator cuff surgery     2005  . TONSILLECTOMY     Social History   Socioeconomic History  . Marital status: Married    Spouse name: Not on file  . Number of children: Not on file  . Years of education: Not on file  . Highest education level: Not on file  Occupational History  . Not on file  Tobacco Use  . Smoking status: Never Smoker  . Smokeless tobacco: Never Used  Substance and Sexual Activity  . Alcohol use: Yes    Alcohol/week: 10.0 standard drinks    Types: 10 Glasses of wine per week    Comment: 2 - 3 glasses of wine daily  . Drug use: No  . Sexual activity: Yes  Other Topics Concern  . Not on file  Social History Narrative  . Not on file   Social Determinants of Health   Financial Resource Strain:   . Difficulty of Paying Living Expenses: Not on file  Food Insecurity:   . Worried About Charity fundraiser in the Last Year: Not on file  . Ran Out of Food in the Last Year: Not on file  Transportation  Needs:   . Lack of Transportation (Medical): Not on file  . Lack of Transportation (Non-Medical): Not on file  Physical Activity:   . Days of Exercise per Week: Not on file  . Minutes of Exercise per Session: Not on file  Stress:   . Feeling of Stress : Not on file  Social Connections:   . Frequency of Communication with Friends and Family: Not on file  . Frequency of Social Gatherings with Friends and Family: Not on file  . Attends Religious Services: Not on file  . Active Member of Clubs or Organizations: Not on file  . Attends Archivist Meetings: Not on file  . Marital Status: Not on file   Family History  Problem Relation Age of Onset  . Colon cancer Mother   . Hypertension Mother   . Pancreatic cancer Father    Allergies  Allergen Reactions  . Penicillins Hives, Swelling and Rash    Has patient had a PCN reaction causing immediate rash, facial/tongue/throat swelling, SOB or lightheadedness with hypotension:No Has patient had a PCN reaction causing severe rash involving mucus membranes or skin necrosis:No Has patient had a PCN reaction that required hospitalization:No Has patient had a PCN  reaction occurring within the last 10 years:No If all of the above answers are "NO", then may proceed with Cephalosporin use.   . Montelukast Sodium Other (See Comments)    When mixed with antidepressants there is a side effect   Prior to Admission medications   Medication Sig Start Date End Date Taking? Authorizing Provider  aspirin EC 81 MG tablet Take 81 mg by mouth daily.     Yes [provider]  Cholecalciferol (VITAMIN D) 1000 UNITS capsule 5,000 UNTIS DAILY Patient taking differently: Take 2,000 Units by mouth daily. [place] [facility] 05/25/13  Yes Jettie Booze, MD  fexofenadine (ALLEGRA) 180 MG tablet Take 180 mg by mouth daily.   Yes [provider]  FLUoxetine (PROZAC) 20 MG capsule Take 60 mg by mouth daily.   Yes [provider]   Multiple Vitamin (MULTIVITAMIN WITH MINERALS) TABS tablet Take 1 tablet by mouth daily.   Yes [provider]  PRALUENT 75 MG/ML SOAJ INJECT 75MG  SUBCUTANEOUSLY  EVERY 2 WEEKS 07/12/18  Yes Jettie Booze, MD  traMADol-acetaminophen (ULTRACET) 37.5-325 MG tablet Take 1 tablet by mouth every 6 (six) hours as needed. 04/02/16  Yes Irine Seal, MD  valACYclovir (VALTREX) 1000 MG tablet Take 1,000 mg by mouth daily.   Yes [provider]     Positive ROS: Otherwise negative  All other systems have been reviewed and were otherwise negative with the exception of those mentioned in the HPI and as above.  Physical Exam: Constitutional: Alert, well-appearing, no acute distress Ears: External ears without lesions or tenderness. Ear canals with mild amount of wax in both ear canals that was cleaned with suction.. Nasal: External nose without lesions. Clear nasal passages Oral: Oropharynx clear. Neck: No palpable adenopathy or masses Respiratory: Breathing comfortably  Skin: No facial/neck lesions or rash noted.  Cerumen impaction removal  Date/Time: 05/31/2019 9:09 AM Performed by: Rozetta Nunnery, MD Authorized by: Rozetta Nunnery, MD   Consent:    Consent obtained:  Verbal   Consent given by:  Patient   Risks discussed:  Pain and bleeding Procedure details:    Location:  L ear and R ear   Procedure type: curette and suction   Post-procedure details:    Inspection:  TM intact and canal normal   Hearing quality:  Improved   Patient tolerance of procedure:  Tolerated well, no immediate complications    Assessment: Cerumen buildup  Plan: He will follow-up as needed  Radene Journey, MD

## 2019-06-12 DIAGNOSIS — Z20828 Contact with and (suspected) exposure to other viral communicable diseases: Secondary | ICD-10-CM | POA: Diagnosis not present

## 2019-06-20 DIAGNOSIS — H1045 Other chronic allergic conjunctivitis: Secondary | ICD-10-CM | POA: Diagnosis not present

## 2019-06-20 DIAGNOSIS — Z880 Allergy status to penicillin: Secondary | ICD-10-CM | POA: Diagnosis not present

## 2019-06-20 DIAGNOSIS — J301 Allergic rhinitis due to pollen: Secondary | ICD-10-CM | POA: Diagnosis not present

## 2019-06-21 DIAGNOSIS — D1801 Hemangioma of skin and subcutaneous tissue: Secondary | ICD-10-CM | POA: Diagnosis not present

## 2019-06-21 DIAGNOSIS — Z85820 Personal history of malignant melanoma of skin: Secondary | ICD-10-CM | POA: Diagnosis not present

## 2019-06-21 DIAGNOSIS — L821 Other seborrheic keratosis: Secondary | ICD-10-CM | POA: Diagnosis not present

## 2019-06-21 DIAGNOSIS — L570 Actinic keratosis: Secondary | ICD-10-CM | POA: Diagnosis not present

## 2019-06-21 DIAGNOSIS — Z85828 Personal history of other malignant neoplasm of skin: Secondary | ICD-10-CM | POA: Diagnosis not present

## 2019-06-21 DIAGNOSIS — D692 Other nonthrombocytopenic purpura: Secondary | ICD-10-CM | POA: Diagnosis not present

## 2019-07-06 ENCOUNTER — Telehealth: Payer: Self-pay | Admitting: Interventional Cardiology

## 2019-07-06 MED ORDER — PRALUENT 75 MG/ML ~~LOC~~ SOAJ: 1.0000 "pen " | SUBCUTANEOUS | 11 refills | Status: DC

## 2019-07-06 NOTE — Telephone Encounter (Signed)
Called pt back, no answer and VM is full so unable to leave message.   It does look like he needs new rx - will send this to pharmacy and try pt again later.

## 2019-07-06 NOTE — Telephone Encounter (Signed)
New Message    Pt is calling and says he is needing 1 shot Of PRALUENT 75 MG/ML SOAJ    Please call

## 2019-07-06 NOTE — Telephone Encounter (Signed)
Pt called clinic again - states he is in [place] for the next few weeks and his Praluent is in [place] where he lives. Will place a sample downstairs since he is due for next Praluent injection tomorrow. Pt very appreciative.

## 2019-07-07 ENCOUNTER — Ambulatory Visit: Payer: Medicare Other

## 2019-07-13 ENCOUNTER — Ambulatory Visit: Payer: Medicare Other | Attending: Internal Medicine

## 2019-07-13 DIAGNOSIS — Z23 Encounter for immunization: Secondary | ICD-10-CM | POA: Insufficient documentation

## 2019-07-13 NOTE — Progress Notes (Signed)
   Covid-19 Vaccination Clinic  Name:  Charles Grant    MRN: [id number] DOB: 08-07-1933  07/13/2019  [name] was observed post Covid-19 immunization for 30 minutes based on pre-vaccination screening without incidence. He was provided with Vaccine Information Sheet and instruction to access the V-Safe system.   [name] was instructed to call 911 with any severe reactions post vaccine: Marland Kitchen Difficulty breathing  . Swelling of your face and throat  . A fast heartbeat  . A bad rash all over your body  . Dizziness and weakness    Immunizations Administered    Name Date Dose VIS Date Route   Pfizer COVID-19 Vaccine 07/13/2019 10:00 AM 0.3 mL 05/19/2019 Intramuscular   Manufacturer: [place]   Lot: [id number]   [place]: [id number]

## 2019-07-18 DIAGNOSIS — E538 Deficiency of other specified B group vitamins: Secondary | ICD-10-CM | POA: Diagnosis not present

## 2019-07-18 DIAGNOSIS — C439 Malignant melanoma of skin, unspecified: Secondary | ICD-10-CM | POA: Diagnosis not present

## 2019-07-18 DIAGNOSIS — H9193 Unspecified hearing loss, bilateral: Secondary | ICD-10-CM | POA: Diagnosis not present

## 2019-07-18 DIAGNOSIS — M549 Dorsalgia, unspecified: Secondary | ICD-10-CM | POA: Diagnosis not present

## 2019-07-18 DIAGNOSIS — E785 Hyperlipidemia, unspecified: Secondary | ICD-10-CM | POA: Diagnosis not present

## 2019-07-18 DIAGNOSIS — R351 Nocturia: Secondary | ICD-10-CM | POA: Diagnosis not present

## 2019-07-18 DIAGNOSIS — Z0000 Encounter for general adult medical examination without abnormal findings: Secondary | ICD-10-CM | POA: Diagnosis not present

## 2019-07-18 DIAGNOSIS — I2510 Atherosclerotic heart disease of native coronary artery without angina pectoris: Secondary | ICD-10-CM | POA: Diagnosis not present

## 2019-07-18 DIAGNOSIS — E559 Vitamin D deficiency, unspecified: Secondary | ICD-10-CM | POA: Diagnosis not present

## 2019-07-18 DIAGNOSIS — H60543 Acute eczematoid otitis externa, bilateral: Secondary | ICD-10-CM | POA: Diagnosis not present

## 2019-08-07 ENCOUNTER — Ambulatory Visit: Payer: Medicare Other | Attending: Internal Medicine

## 2019-08-07 DIAGNOSIS — Z23 Encounter for immunization: Secondary | ICD-10-CM

## 2019-08-07 NOTE — Progress Notes (Signed)
   Covid-19 Vaccination Clinic  Name:  Charles Grant    MRN: [id number] DOB: 1933/06/27  08/07/2019  Charles Grant was observed post Covid-19 immunization for 15 minutes without incidence. He was provided with Vaccine Information Sheet and instruction to access the V-Safe system.   Charles Grant was instructed to call 911 with any severe reactions post vaccine: Marland Kitchen Difficulty breathing  . Swelling of your face and throat  . A fast heartbeat  . A bad rash all over your body  . Dizziness and weakness    Immunizations Administered    Name Date Dose VIS Date Route   Pfizer COVID-19 Vaccine 08/07/2019 11:42 AM 0.3 mL 05/19/2019 Intramuscular   Manufacturer: [place]   Lot: [id number]   [place]: [id number]

## 2019-10-11 DIAGNOSIS — Z961 Presence of intraocular lens: Secondary | ICD-10-CM | POA: Diagnosis not present

## 2019-10-11 DIAGNOSIS — H02035 Senile entropion of left lower eyelid: Secondary | ICD-10-CM | POA: Diagnosis not present

## 2019-10-11 DIAGNOSIS — H179 Unspecified corneal scar and opacity: Secondary | ICD-10-CM | POA: Diagnosis not present

## 2019-10-16 DIAGNOSIS — H1045 Other chronic allergic conjunctivitis: Secondary | ICD-10-CM | POA: Diagnosis not present

## 2019-10-16 DIAGNOSIS — Z880 Allergy status to penicillin: Secondary | ICD-10-CM | POA: Diagnosis not present

## 2019-10-16 DIAGNOSIS — J301 Allergic rhinitis due to pollen: Secondary | ICD-10-CM | POA: Diagnosis not present

## 2019-10-25 ENCOUNTER — Telehealth: Payer: Self-pay | Admitting: Interventional Cardiology

## 2019-10-25 NOTE — Telephone Encounter (Signed)
   Went to chart to check when last seen by Dr. Irish Lack. Advise it was in 05/14/2014, it's been more than 3 years and to re-establish care he will be new patient and need referral. Pt understood and will contact pcp for referral

## 2019-11-03 DIAGNOSIS — D510 Vitamin B12 deficiency anemia due to intrinsic factor deficiency: Secondary | ICD-10-CM | POA: Diagnosis not present

## 2019-11-03 DIAGNOSIS — F339 Major depressive disorder, recurrent, unspecified: Secondary | ICD-10-CM | POA: Diagnosis not present

## 2019-11-03 DIAGNOSIS — F322 Major depressive disorder, single episode, severe without psychotic features: Secondary | ICD-10-CM | POA: Diagnosis not present

## 2019-11-03 DIAGNOSIS — I209 Angina pectoris, unspecified: Secondary | ICD-10-CM | POA: Diagnosis not present

## 2019-11-03 DIAGNOSIS — D649 Anemia, unspecified: Secondary | ICD-10-CM | POA: Diagnosis not present

## 2019-11-03 DIAGNOSIS — I25119 Atherosclerotic heart disease of native coronary artery with unspecified angina pectoris: Secondary | ICD-10-CM | POA: Diagnosis not present

## 2019-11-03 DIAGNOSIS — N400 Enlarged prostate without lower urinary tract symptoms: Secondary | ICD-10-CM | POA: Diagnosis not present

## 2019-11-07 DIAGNOSIS — H02036 Senile entropion of left eye, unspecified eyelid: Secondary | ICD-10-CM | POA: Diagnosis not present

## 2019-11-07 DIAGNOSIS — H02005 Unspecified entropion of left lower eyelid: Secondary | ICD-10-CM | POA: Diagnosis not present

## 2019-11-07 DIAGNOSIS — H0289 Other specified disorders of eyelid: Secondary | ICD-10-CM | POA: Diagnosis not present

## 2019-11-13 DIAGNOSIS — I25119 Atherosclerotic heart disease of native coronary artery with unspecified angina pectoris: Secondary | ICD-10-CM | POA: Diagnosis not present

## 2019-11-13 DIAGNOSIS — N400 Enlarged prostate without lower urinary tract symptoms: Secondary | ICD-10-CM | POA: Diagnosis not present

## 2019-11-13 DIAGNOSIS — D510 Vitamin B12 deficiency anemia due to intrinsic factor deficiency: Secondary | ICD-10-CM | POA: Diagnosis not present

## 2019-11-13 DIAGNOSIS — F339 Major depressive disorder, recurrent, unspecified: Secondary | ICD-10-CM | POA: Diagnosis not present

## 2019-11-13 DIAGNOSIS — D649 Anemia, unspecified: Secondary | ICD-10-CM | POA: Diagnosis not present

## 2019-11-13 DIAGNOSIS — I209 Angina pectoris, unspecified: Secondary | ICD-10-CM | POA: Diagnosis not present

## 2019-11-13 DIAGNOSIS — F322 Major depressive disorder, single episode, severe without psychotic features: Secondary | ICD-10-CM | POA: Diagnosis not present

## 2019-12-25 DIAGNOSIS — I25119 Atherosclerotic heart disease of native coronary artery with unspecified angina pectoris: Secondary | ICD-10-CM | POA: Diagnosis not present

## 2019-12-25 DIAGNOSIS — J309 Allergic rhinitis, unspecified: Secondary | ICD-10-CM | POA: Diagnosis not present

## 2019-12-25 DIAGNOSIS — D510 Vitamin B12 deficiency anemia due to intrinsic factor deficiency: Secondary | ICD-10-CM | POA: Diagnosis not present

## 2019-12-25 DIAGNOSIS — F3342 Major depressive disorder, recurrent, in full remission: Secondary | ICD-10-CM | POA: Diagnosis not present

## 2019-12-25 DIAGNOSIS — E291 Testicular hypofunction: Secondary | ICD-10-CM | POA: Diagnosis not present

## 2019-12-25 DIAGNOSIS — D649 Anemia, unspecified: Secondary | ICD-10-CM | POA: Diagnosis not present

## 2019-12-25 DIAGNOSIS — E559 Vitamin D deficiency, unspecified: Secondary | ICD-10-CM | POA: Diagnosis not present

## 2019-12-25 DIAGNOSIS — H9190 Unspecified hearing loss, unspecified ear: Secondary | ICD-10-CM | POA: Diagnosis not present

## 2019-12-25 DIAGNOSIS — I209 Angina pectoris, unspecified: Secondary | ICD-10-CM | POA: Diagnosis not present

## 2019-12-25 DIAGNOSIS — Z0001 Encounter for general adult medical examination with abnormal findings: Secondary | ICD-10-CM | POA: Diagnosis not present

## 2019-12-25 DIAGNOSIS — N400 Enlarged prostate without lower urinary tract symptoms: Secondary | ICD-10-CM | POA: Diagnosis not present

## 2019-12-26 DIAGNOSIS — E291 Testicular hypofunction: Secondary | ICD-10-CM | POA: Diagnosis not present

## 2020-01-03 DIAGNOSIS — R5383 Other fatigue: Secondary | ICD-10-CM | POA: Diagnosis not present

## 2020-01-03 DIAGNOSIS — J301 Allergic rhinitis due to pollen: Secondary | ICD-10-CM | POA: Diagnosis not present

## 2020-01-03 DIAGNOSIS — H1045 Other chronic allergic conjunctivitis: Secondary | ICD-10-CM | POA: Diagnosis not present

## 2020-01-03 DIAGNOSIS — Z880 Allergy status to penicillin: Secondary | ICD-10-CM | POA: Diagnosis not present

## 2020-01-22 DIAGNOSIS — R5383 Other fatigue: Secondary | ICD-10-CM | POA: Diagnosis not present

## 2020-01-22 DIAGNOSIS — F339 Major depressive disorder, recurrent, unspecified: Secondary | ICD-10-CM | POA: Diagnosis not present

## 2020-01-22 DIAGNOSIS — E538 Deficiency of other specified B group vitamins: Secondary | ICD-10-CM | POA: Diagnosis not present

## 2020-01-23 DIAGNOSIS — F331 Major depressive disorder, recurrent, moderate: Secondary | ICD-10-CM | POA: Diagnosis not present

## 2020-01-24 NOTE — Progress Notes (Signed)
Cardiology Office Note   Date:  01/25/2020   ID:  Charles Grant, DOB March 20, 1934, MRN [id number]  PCP:  Josetta Huddle, MD    No chief complaint on file.  CAD  Wt Readings from Last 3 Encounters:  01/25/20 162 lb 9.6 oz (73.8 kg)  04/02/16 164 lb (74.4 kg)  03/23/16 164 lb 4 oz (74.5 kg)       History of Present Illness: Charles Grant is a 86 y.o. male  who has had CAD, s/p CABG in 05/2011.  Angina at that time was exertional fatigue, along with chest pain.    I have not seen him in several years. He spends a lot of time in [place] and sees a doctor there.  I was called by his PMD last week as the patient was having severe DOE, and there was a concern for anginal equivalent.  About 5 weeks ago, he noticed more fatigue than normal.  [place]Orse in the hot weather.  He has some depression and this was also worse.  This was associated with less energy.  He called Dr. Inda Merlin and saw a psychiatrist and there was a concern for worsening.    What he feels now is different than what he felt before his CABG.  He describes it as an "anemic" feeling.  No chest pain recently.      Past Medical History:  Diagnosis Date  . Anemia   . Coronary artery disease 05/21/2011  . Depression   . History of bronchitis   . Pneumonia    history of   . Urinary frequency     Past Surgical History:  Procedure Laterality Date  . CARDIAC CATHETERIZATION    . CORONARY ARTERY BYPASS GRAFT  05/22/2011   Procedure: CORONARY ARTERY BYPASS GRAFTING (CABG);  Surgeon: Grace Isaac, MD;  Location: [place];  Service: Open Heart Surgery;  Laterality: N/A;  CABG x five;  using left internal mammary artery and right leg greater saphenous vein harvested endoscopically  . CYSTOSCOPY WITH INSERTION OF UROLIFT N/A 04/02/2016   Procedure: CYSTOSCOPY WITH INSERTION OF UROLIFT x4;  Surgeon: Irine Seal, MD;  Location: [facility];  Service: Urology;  Laterality: N/A;  . EYE SURGERY     bilateral cataract surgery   .  right rotator cuff surgery     2005  . TONSILLECTOMY       Current Outpatient Medications  Medication Sig Dispense Refill  . Alirocumab (PRALUENT) 75 MG/ML SOAJ Inject 1 pen into the skin every 14 (fourteen) days. 2 pen 11  . aspirin EC 81 MG tablet Take 81 mg by mouth daily.      . Cholecalciferol (VITAMIN D) 1000 UNITS capsule 5,000 UNTIS DAILY (Patient taking differently: Take 2,000 Units by mouth daily. 5,000 UNTIS DAILY)    . fexofenadine (ALLEGRA) 180 MG tablet Take 180 mg by mouth daily.    Marland Kitchen FLUoxetine (PROZAC) 20 MG capsule Take 60 mg by mouth daily.    . Multiple Vitamin (MULTIVITAMIN WITH MINERALS) TABS tablet Take 1 tablet by mouth daily.    . traMADol-acetaminophen (ULTRACET) 37.5-325 MG tablet Take 1 tablet by mouth every 6 (six) hours as needed. 12 tablet 0  . valACYclovir (VALTREX) 1000 MG tablet Take 1,000 mg by mouth daily.     No current facility-administered medications for this visit.    Allergies:   Penicillins and Montelukast sodium    Social History:  The patient  reports that he has never smoked. He has  never used smokeless tobacco. He reports current alcohol use of about 10.0 standard drinks of alcohol per week. He reports that he does not use drugs.   Family History:  The patient's family history includes Colon cancer in his mother; Hypertension in his mother; Pancreatic cancer in his father.    ROS:  Please see the history of present illness.   Otherwise, review of systems are positive for fatigue.   All other systems are reviewed and negative.    PHYSICAL EXAM: VS:  BP 126/70   Pulse 76   Ht 5\' 8"  (1.727 m)   Wt 162 lb 9.6 oz (73.8 kg)   SpO2 95%   BMI 24.72 kg/m  , BMI Body mass index is 24.72 kg/m. GEN: Well nourished, well developed, in no acute distress  HEENT: normal  Neck: no JVD, carotid bruits, or masses Cardiac: RRR; no murmurs, rubs, or gallops,no edema  Respiratory:  clear to auscultation bilaterally, normal work of breathing GI:  soft, nontender, nondistended, + BS MS: no deformity or atrophy  Skin: warm and dry, no rash Neuro:  Strength and sensation are intact Psych: euthymic mood, full affect   EKG:   The ekg ordered today demonstrates NSR, prolonged PR interval, no ST segment   Recent Labs: No results found for requested labs within last 8760 hours.   Lipid Panel    Component Value Date/Time   CHOL 149 01/13/2016 1410   CHOL 222 (H) 05/15/2013 0739   TRIG 121 01/13/2016 1410   TRIG 76 05/15/2013 0739   HDL 81 01/13/2016 1410   HDL 78 05/15/2013 0739   CHOLHDL 1.8 01/13/2016 1410   VLDL 24 01/13/2016 1410   LDLCALC 44 01/13/2016 1410   LDLCALC 129 (H) 05/15/2013 0739     Other studies Reviewed: Additional studies/ records that were reviewed today with results demonstrating: labs reviewed.   ASSESSMENT AND PLAN:  1. CAD: DOE, concerning for anginal equivalent.  Plan for lexiscan myoview.  He has not been walking much, and would not be able to do much on the treadmill.   2. Hyperlipidemia: Tolerating Praluent.   LDL controlled.  3. Anemia: mild anemia.  Will defer to PCP.     Current medicines are reviewed at length with the patient today.  The patient concerns regarding his medicines were addressed.  The following changes have been made:  No change  Labs/ tests ordered today include:  No orders of the defined types were placed in this encounter.   Recommend 150 minutes/week of aerobic exercise Low fat, low carb, high fiber diet recommended  Disposition:   FU in 1 year   Signed, Larae Grooms, MD  01/25/2020 3:11 PM    [facility] [place], [place], [place]  [address] Phone: [phone]; Fax: [phone]

## 2020-01-25 ENCOUNTER — Ambulatory Visit (INDEPENDENT_AMBULATORY_CARE_PROVIDER_SITE_OTHER): Payer: Medicare Other | Admitting: Interventional Cardiology

## 2020-01-25 ENCOUNTER — Encounter: Payer: Self-pay | Admitting: Interventional Cardiology

## 2020-01-25 ENCOUNTER — Other Ambulatory Visit: Payer: Self-pay

## 2020-01-25 VITALS — BP 126/70 | HR 76 | Ht 68.0 in | Wt 162.6 lb

## 2020-01-25 DIAGNOSIS — R0600 Dyspnea, unspecified: Secondary | ICD-10-CM

## 2020-01-25 DIAGNOSIS — D649 Anemia, unspecified: Secondary | ICD-10-CM | POA: Diagnosis not present

## 2020-01-25 DIAGNOSIS — I25118 Atherosclerotic heart disease of native coronary artery with other forms of angina pectoris: Secondary | ICD-10-CM

## 2020-01-25 DIAGNOSIS — E782 Mixed hyperlipidemia: Secondary | ICD-10-CM | POA: Diagnosis not present

## 2020-01-25 DIAGNOSIS — R0609 Other forms of dyspnea: Secondary | ICD-10-CM

## 2020-01-25 NOTE — Patient Instructions (Signed)
Medication Instructions:  Your physician recommends that you continue on your current medications as directed. Please refer to the Current Medication list given to you today.  *If you need a refill on your cardiac medications before your next appointment, please call your pharmacy*   Lab Work: None  If you have labs (blood work) drawn today and your tests are completely normal, you will receive your results only by:  [place] (if you have MyChart) OR  A paper copy in the mail If you have any lab test that is abnormal or we need to change your treatment, we will call you to review the results.   Testing/Procedures: Your physician has requested that you have a lexiscan myoview. For further information please visit [email or web address]. Please follow instruction sheet, as given.  Follow-Up: At [facility], you and your health needs are our priority.  As part of our continuing mission to provide you with exceptional heart care, we have created designated Provider Care Teams.  These Care Teams include your primary Cardiologist (physician) and Advanced Practice Providers (APPs -  Physician Assistants and Nurse Practitioners) who all work together to provide you with the care you need, when you need it.  We recommend signing up for the patient portal called "MyChart".  Sign up information is provided on this After Visit Summary.  MyChart is used to connect with patients for Virtual Visits (Telemedicine).  Patients are able to view lab/test results, encounter notes, upcoming appointments, etc.  Non-urgent messages can be sent to your provider as well.   To learn more about what you can do with MyChart, go to [email or web address].    Your next appointment:   12 month(s)  The format for your next appointment:   In Person  Provider:   You may see Casandra Doffing, MD or one of the following Advanced Practice Providers on your designated Care Team:    Melina Copa, PA-C  Ermalinda Barrios,  PA-C    Other Instructions None

## 2020-01-29 ENCOUNTER — Telehealth (HOSPITAL_COMMUNITY): Payer: Self-pay | Admitting: *Deleted

## 2020-01-29 NOTE — Telephone Encounter (Signed)
Patient given detailed instructions per Myocardial Perfusion Study Information Sheet for the test on 01/31/20. Patient notified to arrive 15 minutes early and that it is imperative to arrive on time for appointment to keep from having the test rescheduled.  If you need to cancel or reschedule your appointment, please call the office within 24 hours of your appointment. . Patient verbalized understanding. Kirstie Peri

## 2020-01-30 ENCOUNTER — Encounter (HOSPITAL_COMMUNITY): Payer: Medicare Other

## 2020-01-30 DIAGNOSIS — D510 Vitamin B12 deficiency anemia due to intrinsic factor deficiency: Secondary | ICD-10-CM | POA: Diagnosis not present

## 2020-01-30 DIAGNOSIS — F322 Major depressive disorder, single episode, severe without psychotic features: Secondary | ICD-10-CM | POA: Diagnosis not present

## 2020-01-30 DIAGNOSIS — N400 Enlarged prostate without lower urinary tract symptoms: Secondary | ICD-10-CM | POA: Diagnosis not present

## 2020-01-30 DIAGNOSIS — D649 Anemia, unspecified: Secondary | ICD-10-CM | POA: Diagnosis not present

## 2020-01-30 DIAGNOSIS — I25119 Atherosclerotic heart disease of native coronary artery with unspecified angina pectoris: Secondary | ICD-10-CM | POA: Diagnosis not present

## 2020-01-30 DIAGNOSIS — F3342 Major depressive disorder, recurrent, in full remission: Secondary | ICD-10-CM | POA: Diagnosis not present

## 2020-01-30 DIAGNOSIS — I209 Angina pectoris, unspecified: Secondary | ICD-10-CM | POA: Diagnosis not present

## 2020-01-30 DIAGNOSIS — F339 Major depressive disorder, recurrent, unspecified: Secondary | ICD-10-CM | POA: Diagnosis not present

## 2020-01-31 ENCOUNTER — Ambulatory Visit (HOSPITAL_COMMUNITY): Payer: Medicare Other | Attending: Cardiology

## 2020-01-31 ENCOUNTER — Other Ambulatory Visit: Payer: Self-pay

## 2020-01-31 DIAGNOSIS — R0600 Dyspnea, unspecified: Secondary | ICD-10-CM | POA: Diagnosis not present

## 2020-01-31 DIAGNOSIS — R0609 Other forms of dyspnea: Secondary | ICD-10-CM

## 2020-01-31 LAB — MYOCARDIAL PERFUSION IMAGING
LV dias vol: 85 mL (ref 62–150)
LV sys vol: 33 mL
Peak HR: 93 {beats}/min
Rest HR: 65 {beats}/min
SDS: 0
SRS: 0
SSS: 0
TID: 1.1

## 2020-01-31 MED ORDER — TECHNETIUM TC 99M TETROFOSMIN IV KIT
32.1000 | PACK | Freq: Once | INTRAVENOUS | Status: AC | PRN
  Administered 2020-01-31: 32.1 via INTRAVENOUS
  Filled 2020-01-31: qty 33

## 2020-01-31 MED ORDER — TECHNETIUM TC 99M TETROFOSMIN IV KIT
10.8000 | PACK | Freq: Once | INTRAVENOUS | Status: AC | PRN
  Administered 2020-01-31: 10.8 via INTRAVENOUS
  Filled 2020-01-31: qty 11

## 2020-01-31 MED ORDER — REGADENOSON 0.4 MG/5ML IV SOLN
0.4000 mg | Freq: Once | INTRAVENOUS | Status: AC
  Administered 2020-01-31: 0.4 mg via INTRAVENOUS

## 2020-02-01 DIAGNOSIS — F331 Major depressive disorder, recurrent, moderate: Secondary | ICD-10-CM | POA: Diagnosis not present

## 2020-02-12 DIAGNOSIS — F331 Major depressive disorder, recurrent, moderate: Secondary | ICD-10-CM | POA: Diagnosis not present

## 2020-02-19 DIAGNOSIS — Z23 Encounter for immunization: Secondary | ICD-10-CM | POA: Diagnosis not present

## 2020-02-26 DIAGNOSIS — F331 Major depressive disorder, recurrent, moderate: Secondary | ICD-10-CM | POA: Diagnosis not present

## 2020-03-10 DIAGNOSIS — Z23 Encounter for immunization: Secondary | ICD-10-CM | POA: Diagnosis not present

## 2020-03-12 DIAGNOSIS — F331 Major depressive disorder, recurrent, moderate: Secondary | ICD-10-CM | POA: Diagnosis not present

## 2020-03-18 DIAGNOSIS — F331 Major depressive disorder, recurrent, moderate: Secondary | ICD-10-CM | POA: Diagnosis not present

## 2020-03-26 DIAGNOSIS — Z85828 Personal history of other malignant neoplasm of skin: Secondary | ICD-10-CM | POA: Diagnosis not present

## 2020-03-26 DIAGNOSIS — D1801 Hemangioma of skin and subcutaneous tissue: Secondary | ICD-10-CM | POA: Diagnosis not present

## 2020-03-26 DIAGNOSIS — D485 Neoplasm of uncertain behavior of skin: Secondary | ICD-10-CM | POA: Diagnosis not present

## 2020-03-26 DIAGNOSIS — C44722 Squamous cell carcinoma of skin of right lower limb, including hip: Secondary | ICD-10-CM | POA: Diagnosis not present

## 2020-03-26 DIAGNOSIS — L812 Freckles: Secondary | ICD-10-CM | POA: Diagnosis not present

## 2020-03-26 DIAGNOSIS — L821 Other seborrheic keratosis: Secondary | ICD-10-CM | POA: Diagnosis not present

## 2020-03-26 DIAGNOSIS — L570 Actinic keratosis: Secondary | ICD-10-CM | POA: Diagnosis not present

## 2020-03-26 DIAGNOSIS — Z85820 Personal history of malignant melanoma of skin: Secondary | ICD-10-CM | POA: Diagnosis not present

## 2020-04-05 DIAGNOSIS — D649 Anemia, unspecified: Secondary | ICD-10-CM | POA: Diagnosis not present

## 2020-04-05 DIAGNOSIS — F3342 Major depressive disorder, recurrent, in full remission: Secondary | ICD-10-CM | POA: Diagnosis not present

## 2020-04-05 DIAGNOSIS — N400 Enlarged prostate without lower urinary tract symptoms: Secondary | ICD-10-CM | POA: Diagnosis not present

## 2020-04-05 DIAGNOSIS — F322 Major depressive disorder, single episode, severe without psychotic features: Secondary | ICD-10-CM | POA: Diagnosis not present

## 2020-04-05 DIAGNOSIS — D510 Vitamin B12 deficiency anemia due to intrinsic factor deficiency: Secondary | ICD-10-CM | POA: Diagnosis not present

## 2020-04-05 DIAGNOSIS — I209 Angina pectoris, unspecified: Secondary | ICD-10-CM | POA: Diagnosis not present

## 2020-04-05 DIAGNOSIS — I25119 Atherosclerotic heart disease of native coronary artery with unspecified angina pectoris: Secondary | ICD-10-CM | POA: Diagnosis not present

## 2020-04-05 DIAGNOSIS — F339 Major depressive disorder, recurrent, unspecified: Secondary | ICD-10-CM | POA: Diagnosis not present

## 2020-05-13 DIAGNOSIS — D649 Anemia, unspecified: Secondary | ICD-10-CM | POA: Diagnosis not present

## 2020-05-13 DIAGNOSIS — F3342 Major depressive disorder, recurrent, in full remission: Secondary | ICD-10-CM | POA: Diagnosis not present

## 2020-05-13 DIAGNOSIS — I25119 Atherosclerotic heart disease of native coronary artery with unspecified angina pectoris: Secondary | ICD-10-CM | POA: Diagnosis not present

## 2020-05-13 DIAGNOSIS — F322 Major depressive disorder, single episode, severe without psychotic features: Secondary | ICD-10-CM | POA: Diagnosis not present

## 2020-05-13 DIAGNOSIS — D510 Vitamin B12 deficiency anemia due to intrinsic factor deficiency: Secondary | ICD-10-CM | POA: Diagnosis not present

## 2020-05-13 DIAGNOSIS — I209 Angina pectoris, unspecified: Secondary | ICD-10-CM | POA: Diagnosis not present

## 2020-05-13 DIAGNOSIS — F331 Major depressive disorder, recurrent, moderate: Secondary | ICD-10-CM | POA: Diagnosis not present

## 2020-05-13 DIAGNOSIS — N400 Enlarged prostate without lower urinary tract symptoms: Secondary | ICD-10-CM | POA: Diagnosis not present

## 2020-05-13 DIAGNOSIS — F339 Major depressive disorder, recurrent, unspecified: Secondary | ICD-10-CM | POA: Diagnosis not present

## 2020-06-02 DIAGNOSIS — U071 COVID-19: Secondary | ICD-10-CM | POA: Diagnosis not present

## 2020-06-25 ENCOUNTER — Other Ambulatory Visit: Payer: Self-pay | Admitting: Interventional Cardiology

## 2020-06-25 DIAGNOSIS — E782 Mixed hyperlipidemia: Secondary | ICD-10-CM

## 2020-07-16 DIAGNOSIS — E538 Deficiency of other specified B group vitamins: Secondary | ICD-10-CM | POA: Diagnosis not present

## 2020-07-16 DIAGNOSIS — H9193 Unspecified hearing loss, bilateral: Secondary | ICD-10-CM | POA: Diagnosis not present

## 2020-07-16 DIAGNOSIS — I2510 Atherosclerotic heart disease of native coronary artery without angina pectoris: Secondary | ICD-10-CM | POA: Diagnosis not present

## 2020-07-16 DIAGNOSIS — M549 Dorsalgia, unspecified: Secondary | ICD-10-CM | POA: Diagnosis not present

## 2020-07-16 DIAGNOSIS — E785 Hyperlipidemia, unspecified: Secondary | ICD-10-CM | POA: Diagnosis not present

## 2020-07-16 DIAGNOSIS — R351 Nocturia: Secondary | ICD-10-CM | POA: Diagnosis not present

## 2020-07-16 DIAGNOSIS — Z0000 Encounter for general adult medical examination without abnormal findings: Secondary | ICD-10-CM | POA: Diagnosis not present

## 2020-07-16 DIAGNOSIS — H60543 Acute eczematoid otitis externa, bilateral: Secondary | ICD-10-CM | POA: Diagnosis not present

## 2020-07-16 DIAGNOSIS — C439 Malignant melanoma of skin, unspecified: Secondary | ICD-10-CM | POA: Diagnosis not present

## 2020-07-16 DIAGNOSIS — E559 Vitamin D deficiency, unspecified: Secondary | ICD-10-CM | POA: Diagnosis not present

## 2020-07-19 DIAGNOSIS — H179 Unspecified corneal scar and opacity: Secondary | ICD-10-CM | POA: Diagnosis not present

## 2020-07-19 DIAGNOSIS — H02036 Senile entropion of left eye, unspecified eyelid: Secondary | ICD-10-CM | POA: Diagnosis not present

## 2020-07-19 DIAGNOSIS — Z961 Presence of intraocular lens: Secondary | ICD-10-CM | POA: Diagnosis not present

## 2020-08-08 DIAGNOSIS — H02005 Unspecified entropion of left lower eyelid: Secondary | ICD-10-CM | POA: Diagnosis not present

## 2020-08-08 DIAGNOSIS — H02055 Trichiasis without entropian left lower eyelid: Secondary | ICD-10-CM | POA: Diagnosis not present

## 2020-09-05 DIAGNOSIS — D510 Vitamin B12 deficiency anemia due to intrinsic factor deficiency: Secondary | ICD-10-CM | POA: Diagnosis not present

## 2020-09-05 DIAGNOSIS — D649 Anemia, unspecified: Secondary | ICD-10-CM | POA: Diagnosis not present

## 2020-09-05 DIAGNOSIS — I25119 Atherosclerotic heart disease of native coronary artery with unspecified angina pectoris: Secondary | ICD-10-CM | POA: Diagnosis not present

## 2020-09-05 DIAGNOSIS — N400 Enlarged prostate without lower urinary tract symptoms: Secondary | ICD-10-CM | POA: Diagnosis not present

## 2020-09-05 DIAGNOSIS — F339 Major depressive disorder, recurrent, unspecified: Secondary | ICD-10-CM | POA: Diagnosis not present

## 2020-09-05 DIAGNOSIS — I209 Angina pectoris, unspecified: Secondary | ICD-10-CM | POA: Diagnosis not present

## 2020-09-10 DIAGNOSIS — H04123 Dry eye syndrome of bilateral lacrimal glands: Secondary | ICD-10-CM | POA: Diagnosis not present

## 2020-09-17 DIAGNOSIS — Z23 Encounter for immunization: Secondary | ICD-10-CM | POA: Diagnosis not present

## 2020-10-16 DIAGNOSIS — I2510 Atherosclerotic heart disease of native coronary artery without angina pectoris: Secondary | ICD-10-CM | POA: Diagnosis not present

## 2020-10-16 DIAGNOSIS — H02105 Unspecified ectropion of left lower eyelid: Secondary | ICD-10-CM | POA: Diagnosis not present

## 2020-10-24 DIAGNOSIS — Z9889 Other specified postprocedural states: Secondary | ICD-10-CM | POA: Diagnosis not present

## 2020-10-24 DIAGNOSIS — Z48810 Encounter for surgical aftercare following surgery on the sense organs: Secondary | ICD-10-CM | POA: Diagnosis not present

## 2020-11-21 DIAGNOSIS — T887XXA Unspecified adverse effect of drug or medicament, initial encounter: Secondary | ICD-10-CM | POA: Diagnosis not present

## 2020-11-21 DIAGNOSIS — L739 Follicular disorder, unspecified: Secondary | ICD-10-CM | POA: Diagnosis not present

## 2020-12-30 DIAGNOSIS — Z961 Presence of intraocular lens: Secondary | ICD-10-CM | POA: Diagnosis not present

## 2020-12-30 DIAGNOSIS — H179 Unspecified corneal scar and opacity: Secondary | ICD-10-CM | POA: Diagnosis not present

## 2020-12-31 DIAGNOSIS — L570 Actinic keratosis: Secondary | ICD-10-CM | POA: Diagnosis not present

## 2020-12-31 DIAGNOSIS — D1801 Hemangioma of skin and subcutaneous tissue: Secondary | ICD-10-CM | POA: Diagnosis not present

## 2020-12-31 DIAGNOSIS — D692 Other nonthrombocytopenic purpura: Secondary | ICD-10-CM | POA: Diagnosis not present

## 2020-12-31 DIAGNOSIS — L821 Other seborrheic keratosis: Secondary | ICD-10-CM | POA: Diagnosis not present

## 2020-12-31 DIAGNOSIS — Z85820 Personal history of malignant melanoma of skin: Secondary | ICD-10-CM | POA: Diagnosis not present

## 2020-12-31 DIAGNOSIS — L812 Freckles: Secondary | ICD-10-CM | POA: Diagnosis not present

## 2020-12-31 DIAGNOSIS — Z85828 Personal history of other malignant neoplasm of skin: Secondary | ICD-10-CM | POA: Diagnosis not present

## 2021-01-16 DIAGNOSIS — E669 Obesity, unspecified: Secondary | ICD-10-CM | POA: Diagnosis not present

## 2021-01-22 DIAGNOSIS — H1045 Other chronic allergic conjunctivitis: Secondary | ICD-10-CM | POA: Diagnosis not present

## 2021-01-22 DIAGNOSIS — Z880 Allergy status to penicillin: Secondary | ICD-10-CM | POA: Diagnosis not present

## 2021-01-22 DIAGNOSIS — J301 Allergic rhinitis due to pollen: Secondary | ICD-10-CM | POA: Diagnosis not present

## 2021-02-12 DIAGNOSIS — I25119 Atherosclerotic heart disease of native coronary artery with unspecified angina pectoris: Secondary | ICD-10-CM | POA: Diagnosis not present

## 2021-02-12 DIAGNOSIS — D510 Vitamin B12 deficiency anemia due to intrinsic factor deficiency: Secondary | ICD-10-CM | POA: Diagnosis not present

## 2021-02-12 DIAGNOSIS — F339 Major depressive disorder, recurrent, unspecified: Secondary | ICD-10-CM | POA: Diagnosis not present

## 2021-02-12 DIAGNOSIS — I209 Angina pectoris, unspecified: Secondary | ICD-10-CM | POA: Diagnosis not present

## 2021-02-12 DIAGNOSIS — D649 Anemia, unspecified: Secondary | ICD-10-CM | POA: Diagnosis not present

## 2021-02-12 DIAGNOSIS — N400 Enlarged prostate without lower urinary tract symptoms: Secondary | ICD-10-CM | POA: Diagnosis not present

## 2021-02-27 DIAGNOSIS — Z23 Encounter for immunization: Secondary | ICD-10-CM | POA: Diagnosis not present

## 2021-03-04 DIAGNOSIS — F339 Major depressive disorder, recurrent, unspecified: Secondary | ICD-10-CM | POA: Diagnosis not present

## 2021-03-04 DIAGNOSIS — D510 Vitamin B12 deficiency anemia due to intrinsic factor deficiency: Secondary | ICD-10-CM | POA: Diagnosis not present

## 2021-03-04 DIAGNOSIS — E669 Obesity, unspecified: Secondary | ICD-10-CM | POA: Diagnosis not present

## 2021-03-04 DIAGNOSIS — I25119 Atherosclerotic heart disease of native coronary artery with unspecified angina pectoris: Secondary | ICD-10-CM | POA: Diagnosis not present

## 2021-03-25 ENCOUNTER — Telehealth: Payer: Self-pay | Admitting: Interventional Cardiology

## 2021-03-25 DIAGNOSIS — R202 Paresthesia of skin: Secondary | ICD-10-CM

## 2021-03-25 DIAGNOSIS — R200 Anesthesia of skin: Secondary | ICD-10-CM

## 2021-03-25 DIAGNOSIS — M79604 Pain in right leg: Secondary | ICD-10-CM

## 2021-03-25 NOTE — Telephone Encounter (Signed)
OK to order lower extremity arterial DOppler for leg pain.

## 2021-03-25 NOTE — Telephone Encounter (Signed)
Pt is calling regarding his right foot tingling for 3 months. Pt would like a scan done on his right foot. Please advise pt further.

## 2021-03-25 NOTE — Telephone Encounter (Signed)
Called patient back about message. Patient worried about his lower right leg having blockage that when he crosses his leg it started tingling and he could not feel it to bare weight and he almost fell down. Patient stated it does not do it unless he crosses his leg. Will forward to Dr. Irish Lack for advisement.

## 2021-03-26 DIAGNOSIS — M25512 Pain in left shoulder: Secondary | ICD-10-CM | POA: Diagnosis not present

## 2021-03-26 NOTE — Telephone Encounter (Signed)
Patient notified.  He is aware study is done at our [place] office and that he will be called with appointment information.

## 2021-04-04 ENCOUNTER — Ambulatory Visit (HOSPITAL_COMMUNITY)
Admission: RE | Admit: 2021-04-04 | Discharge: 2021-04-04 | Disposition: A | Discharge status: Home / Self Care | Payer: Medicare Other | Source: Ambulatory Visit | Attending: Cardiovascular Disease | Admitting: Cardiovascular Disease

## 2021-04-04 ENCOUNTER — Other Ambulatory Visit: Payer: Self-pay

## 2021-04-04 DIAGNOSIS — R200 Anesthesia of skin: Secondary | ICD-10-CM

## 2021-04-04 DIAGNOSIS — R202 Paresthesia of skin: Secondary | ICD-10-CM

## 2021-04-04 DIAGNOSIS — M79604 Pain in right leg: Secondary | ICD-10-CM | POA: Diagnosis not present

## 2021-04-27 ENCOUNTER — Encounter: Payer: Self-pay | Admitting: Physician Assistant

## 2021-04-27 NOTE — Progress Notes (Addendum)
Cardiology Office Note    Date:  04/29/2021   ID:  Charles Grant, Charles Grant 03/03/1934, MRN [id number]  PCP:  Josetta Huddle, MD  Cardiologist:  Larae Grooms, MD  Electrophysiologist:  None   Chief Complaint: f/u CAD  History of Present Illness:   Charles Grant is a 87 y.o. male with history of CAD s/p CABG 05/2011, depression, anemia, HLD (on [id number]), RBBB + LAFB + first degree AVB by EKG who presents for follow-up. He had remote bypass as above by Dr. Servando Snare in 2012. No prior echo on file. He has not had cath in our system since bypass surgery. He was last seen 01/2020 by Dr. Irish Lack with increasing DOE and fatigue. He underwent stress testing which was normal, EF 61%. He also called in in October reporting right leg pain, requesting further testing. LE ABI/dopplers were normal. He did not previously tolerate Zetia, Lipitor, Crestor, or Pravastatin so has been on Praluent. He spends some of his time in [place] so sees primary care both there and locally.  He is seen back today for follow-up stating he's doing very well. He denies any CP, dyspnea, leg pain, edema, dizziness or syncope. He denies any specific concerns today. He has not noticed any low heart rates or signs of GI bleeding. He self-discontinued his ASA - states he thought he was supposed to stop this when he started Praluent.  Labwork independently reviewed: Scanned labs 07/2020 trig 78, HDL 78, LDL 76, Cr 1.09, K 4.4, albumin wnl, AST/ALT OK, Hgb 11.9, plt 172, TSH wnl   Past Medical History:  Diagnosis Date   Anemia    Coronary artery disease 05/21/2011   CABG 2012   Depression    History of bronchitis    Hyperlipidemia    Mild anemia    Pneumonia    history of    Urinary frequency     Past Surgical History:  Procedure Laterality Date   CARDIAC CATHETERIZATION     CORONARY ARTERY BYPASS GRAFT  05/22/2011   Procedure: CORONARY ARTERY BYPASS GRAFTING (CABG);  Surgeon: Grace Isaac, MD;  Location: [place];   Service: Open Heart Surgery;  Laterality: N/A;  CABG x five;  using left internal mammary artery and right leg greater saphenous vein harvested endoscopically   CYSTOSCOPY WITH INSERTION OF UROLIFT N/A 04/02/2016   Procedure: CYSTOSCOPY WITH INSERTION OF UROLIFT x4;  Surgeon: Irine Seal, MD;  Location[facility];  Service: Urology;  Laterality: N/A;   EYE SURGERY     bilateral cataract surgery    right rotator cuff surgery     2005   TONSILLECTOMY      Current Medications: Current Meds  Medication Sig   Cholecalciferol (VITAMIN D) 1000 UNITS capsule 5,000 UNTIS DAILY (Patient taking differently: Take 2,000 Units by mouth daily. 5,000 UNTIS DAILY)   fexofenadine (ALLEGRA) 180 MG tablet Take 180 mg by mouth daily.   FLUoxetine (PROZAC) 20 MG capsule Take 60 mg by mouth daily.   Multiple Vitamin (MULTIVITAMIN WITH MINERALS) TABS tablet Take 1 tablet by mouth daily.   PRALUENT 75 MG/ML SOAJ INJECT 75MG  SUBCUTANEOUSLY  EVERY 2 WEEKS   valACYclovir (VALTREX) 1000 MG tablet Take 1,000 mg by mouth daily.      Allergies:   Penicillins and Montelukast sodium   Social History   Socioeconomic History   Marital status: Married    Spouse name: Not on file   Number of children: Not on file   Years of education: Not  on file   Highest education level: Not on file  Occupational History   Not on file  Tobacco Use   Smoking status: Never   Smokeless tobacco: Never  Substance and Sexual Activity   Alcohol use: Yes    Alcohol/week: 10.0 standard drinks    Types: 10 Glasses of wine per week    Comment: 2 - 3 glasses of wine daily   Drug use: No   Sexual activity: Yes  Other Topics Concern   Not on file  Social History Narrative   Not on file   Social Determinants of Health   Financial Resource Strain: Not on file  Food Insecurity: Not on file  Transportation Needs: Not on file  Physical Activity: Not on file  Stress: Not on file  Social Connections: Not on file     Family History:   The patient's family history includes Colon cancer in his mother; Hypertension in his mother; Pancreatic cancer in his father.  ROS:   Please see the history of present illness. Reports longstanding mild generalized fatigue, no acute changes. All other systems are reviewed and otherwise negative.    EKGs/Labs/Other Studies Reviewed:    Studies reviewed are outlined and summarized above. Reports included below if pertinent.  ABI/LE doppler study 03/2021    Summary:  Right: Resting right ankle-brachial index is within normal range. No  evidence of significant right lower extremity arterial disease. The right  toe-brachial index is normal.   Left: Resting left ankle-brachial index is within normal range. No  evidence of significant left lower extremity arterial disease. The left  toe-brachial index is normal.       *See table(s) above for measurements and observations.       Electronically signed by Kathlyn Sacramento MD on 04/04/2021 at 3:39:39 PM.    NST 01/2020 Nuclear stress EF: 61%. The left ventricular ejection fraction is normal (55-65%). There was no ST segment deviation noted during stress. The study is normal. This is a low risk study    EKG:  EKG is ordered today, personally reviewed, demonstrating NSR 62bpm, first degree AVB, RBBB, no acute change from prior.  Recent Labs: No results found for requested labs within last 8760 hours.  Recent Lipid Panel    Component Value Date/Time   CHOL 149 01/13/2016 1410   CHOL 222 (H) 05/15/2013 0739   TRIG 121 01/13/2016 1410   TRIG 76 05/15/2013 0739   HDL 81 01/13/2016 1410   HDL 78 05/15/2013 0739   CHOLHDL 1.8 01/13/2016 1410   VLDL 24 01/13/2016 1410   LDLCALC 44 01/13/2016 1410   LDLCALC 129 (H) 05/15/2013 0739    PHYSICAL EXAM:    VS:  BP 128/72   Pulse 62   Ht 5\' 8"  (1.727 m)   Wt 163 lb 9.6 oz (74.2 kg)   SpO2 96%   BMI 24.88 kg/m   BMI: Body mass index is 24.88 kg/m.  GEN: Well nourished, well  developed male in no acute distress HEENT: normocephalic, atraumatic Neck: no JVD, carotid bruits, or masses Cardiac: RRR; no murmurs, rubs, or gallops, no edema  Respiratory:  clear to auscultation bilaterally, normal work of breathing GI: soft, nontender, nondistended, + BS MS: no deformity or atrophy Skin: warm and dry, no rash Neuro:  Alert and Oriented x 3, Strength and sensation are intact, follows commands Psych: euthymic mood, full affect  Wt Readings from Last 3 Encounters:  04/29/21 163 lb 9.6 oz (74.2 kg)  01/31/20 162 lb (  73.5 kg)  01/25/20 162 lb 9.6 oz (73.8 kg)     ASSESSMENT & PLAN:   1. CAD s/p prior CABG - doing very well currently without any recurrent/residual angina or dyspnea. I would like him to restart his baby aspirin as he erroneously self-discontinued this. He denies any signs of abnormal bleeding/hospitalizations for such. He will continue Praluent. Avoid BB with underlying conduction disease.  2. Hyperlipidemia goal LDL <70 - now on Praluent, lipids followed in primary care, last LDL was slightly above goal at 76 but close. He is statin intolerant. I would recommend to continue Praluent. He will continue to follow for yearly labs with PCP.  3. RBBB with  LAFB + first degree AVB - the RBBB looks accentuated from last EKG (which had more subtle RSR pattern). However, in reviewing prior tracings, today's EKG looks similar to 2015 when the RBBB/LAFB were present as well (along with borderline first degree AVB). The PR interval began to exceed 269ms by the time of 2017 EKG. He is not having any symptoms to suggest higher grade AV block. No prior echocardiogram on file. In the total absence of angina or dyspnea, will hold off further testing, but observe closely at each OV for any new symptoms. We discussed symptoms of bradycardia and monitoring HR at home periodically, notifying for any heart rates less than 50. Will update BMET and TSH with labs. Continue to avoid AVN  blocking agents.  4. History of mild anemia - check CBC today.    Disposition: F/u with Dr. Irish Lack in 1 year.   Medication Adjustments/Labs and Tests Ordered: Current medicines are reviewed at length with the patient today.  Concerns regarding medicines are outlined above. Medication changes, Labs and Tests ordered today are summarized above and listed in the Patient Instructions accessible in Encounters.   Signed, Charlie Pitter, PA-C  04/29/2021 1:55 PM    [facility] [place], [place], [place]  [address] Phone: [phone]; Fax: [phone]

## 2021-04-29 ENCOUNTER — Encounter: Payer: Self-pay | Admitting: Physician Assistant

## 2021-04-29 ENCOUNTER — Other Ambulatory Visit: Payer: Self-pay

## 2021-04-29 ENCOUNTER — Ambulatory Visit (INDEPENDENT_AMBULATORY_CARE_PROVIDER_SITE_OTHER): Payer: Medicare Other | Admitting: Physician Assistant

## 2021-04-29 VITALS — BP 128/72 | HR 62 | Ht 68.0 in | Wt 163.6 lb

## 2021-04-29 DIAGNOSIS — E785 Hyperlipidemia, unspecified: Secondary | ICD-10-CM | POA: Diagnosis not present

## 2021-04-29 DIAGNOSIS — I2510 Atherosclerotic heart disease of native coronary artery without angina pectoris: Secondary | ICD-10-CM

## 2021-04-29 DIAGNOSIS — Z951 Presence of aortocoronary bypass graft: Secondary | ICD-10-CM

## 2021-04-29 DIAGNOSIS — I452 Bifascicular block: Secondary | ICD-10-CM | POA: Diagnosis not present

## 2021-04-29 DIAGNOSIS — R0609 Other forms of dyspnea: Secondary | ICD-10-CM

## 2021-04-29 DIAGNOSIS — D649 Anemia, unspecified: Secondary | ICD-10-CM | POA: Diagnosis not present

## 2021-04-29 MED ORDER — ASPIRIN EC 81 MG PO TBEC: 81.0000 mg | DELAYED_RELEASE_TABLET | Freq: Every day | ORAL | 3 refills | Status: AC

## 2021-04-29 NOTE — Patient Instructions (Addendum)
Medication Instructions:  Your physician has recommended you make the following change in your medication:   RESTART Aspirin 81 mg    *If you need a refill on your cardiac medications before your next appointment, please call your pharmacy*   Lab Work: TODAY:  CBC & TSH   If you have labs (blood work) drawn today and your tests are completely normal, you will receive your results only by: [place] (if you have MyChart) OR A paper copy in the mail If you have any lab test that is abnormal or we need to change your treatment, we will call you to review the results.   Testing/Procedures: None ordered   Follow-Up: At [facility], you and your health needs are our priority.  As part of our continuing mission to provide you with exceptional heart care, we have created designated Provider Care Teams.  These Care Teams include your primary Cardiologist (physician) and Advanced Practice Providers (APPs -  Physician Assistants and Nurse Practitioners) who all work together to provide you with the care you need, when you need it.  We recommend signing up for the patient portal called "MyChart".  Sign up information is provided on this After Visit Summary.  MyChart is used to connect with patients for Virtual Visits (Telemedicine).  Patients are able to view lab/test results, encounter notes, upcoming appointments, etc.  Non-urgent messages can be sent to your provider as well.   To learn more about what you can do with MyChart, go to [email or web address].    Your next appointment:   12 month(s)  The format for your next appointment:   In Person  Provider:   Larae Grooms, MD     Other Instructions

## 2021-04-29 NOTE — Addendum Note (Signed)
Addended by: Gaetano Net on: 04/29/2021 02:22 PM   Modules accepted: Orders

## 2021-04-30 LAB — BASIC METABOLIC PANEL
BUN/Creatinine Ratio: 21 (ref 10–24)
BUN: 22 mg/dL (ref 8–27)
CO2: 26 mmol/L (ref 20–29)
Calcium: 9.6 mg/dL (ref 8.6–10.2)
Chloride: 97 mmol/L (ref 96–106)
Creatinine, Ser: 1.04 mg/dL (ref 0.76–1.27)
Glucose: 95 mg/dL (ref 70–99)
Potassium: 4.5 mmol/L (ref 3.5–5.2)
Sodium: 136 mmol/L (ref 134–144)
eGFR: 70 mL/min/{1.73_m2} (ref >59)

## 2021-04-30 LAB — CBC
Hematocrit: 34.8 % — ABNORMAL LOW (ref 37.5–51.0)
Hemoglobin: 12.2 g/dL — ABNORMAL LOW (ref 13.0–17.7)
MCH: 35.5 pg — ABNORMAL HIGH (ref 26.6–33.0)
MCHC: 35.1 g/dL (ref 31.5–35.7)
MCV: 101 fL — ABNORMAL HIGH (ref 79–97)
Platelets: 174 10*3/uL (ref 150–450)
RBC: 3.44 x10E6/uL — ABNORMAL LOW (ref 4.14–5.80)
RDW: 12.5 % (ref 11.6–15.4)
WBC: 4.8 10*3/uL (ref 3.4–10.8)

## 2021-04-30 LAB — TSH: TSH: 3.53 u[IU]/mL (ref 0.450–4.500)

## 2021-05-27 ENCOUNTER — Other Ambulatory Visit: Payer: Self-pay | Admitting: Interventional Cardiology

## 2021-05-27 DIAGNOSIS — E782 Mixed hyperlipidemia: Secondary | ICD-10-CM

## 2021-06-26 DIAGNOSIS — Z85828 Personal history of other malignant neoplasm of skin: Secondary | ICD-10-CM | POA: Diagnosis not present

## 2021-06-26 DIAGNOSIS — L821 Other seborrheic keratosis: Secondary | ICD-10-CM | POA: Diagnosis not present

## 2021-06-26 DIAGNOSIS — Z85820 Personal history of malignant melanoma of skin: Secondary | ICD-10-CM | POA: Diagnosis not present

## 2021-06-26 DIAGNOSIS — L820 Inflamed seborrheic keratosis: Secondary | ICD-10-CM | POA: Diagnosis not present

## 2021-06-26 DIAGNOSIS — L570 Actinic keratosis: Secondary | ICD-10-CM | POA: Diagnosis not present

## 2021-07-22 DIAGNOSIS — M549 Dorsalgia, unspecified: Secondary | ICD-10-CM | POA: Diagnosis not present

## 2021-07-22 DIAGNOSIS — Z0000 Encounter for general adult medical examination without abnormal findings: Secondary | ICD-10-CM | POA: Diagnosis not present

## 2021-07-22 DIAGNOSIS — R351 Nocturia: Secondary | ICD-10-CM | POA: Diagnosis not present

## 2021-07-22 DIAGNOSIS — E785 Hyperlipidemia, unspecified: Secondary | ICD-10-CM | POA: Diagnosis not present

## 2021-07-22 DIAGNOSIS — E559 Vitamin D deficiency, unspecified: Secondary | ICD-10-CM | POA: Diagnosis not present

## 2021-07-22 DIAGNOSIS — H60543 Acute eczematoid otitis externa, bilateral: Secondary | ICD-10-CM | POA: Diagnosis not present

## 2021-07-22 DIAGNOSIS — C439 Malignant melanoma of skin, unspecified: Secondary | ICD-10-CM | POA: Diagnosis not present

## 2021-07-22 DIAGNOSIS — E538 Deficiency of other specified B group vitamins: Secondary | ICD-10-CM | POA: Diagnosis not present

## 2021-07-22 DIAGNOSIS — H9193 Unspecified hearing loss, bilateral: Secondary | ICD-10-CM | POA: Diagnosis not present

## 2021-07-22 DIAGNOSIS — I2510 Atherosclerotic heart disease of native coronary artery without angina pectoris: Secondary | ICD-10-CM | POA: Diagnosis not present

## 2021-12-10 DIAGNOSIS — H903 Sensorineural hearing loss, bilateral: Secondary | ICD-10-CM | POA: Diagnosis not present

## 2021-12-31 DIAGNOSIS — Z0000 Encounter for general adult medical examination without abnormal findings: Secondary | ICD-10-CM | POA: Diagnosis not present

## 2021-12-31 DIAGNOSIS — E669 Obesity, unspecified: Secondary | ICD-10-CM | POA: Diagnosis not present

## 2021-12-31 DIAGNOSIS — E291 Testicular hypofunction: Secondary | ICD-10-CM | POA: Diagnosis not present

## 2021-12-31 DIAGNOSIS — D510 Vitamin B12 deficiency anemia due to intrinsic factor deficiency: Secondary | ICD-10-CM | POA: Diagnosis not present

## 2021-12-31 DIAGNOSIS — Z1331 Encounter for screening for depression: Secondary | ICD-10-CM | POA: Diagnosis not present

## 2021-12-31 DIAGNOSIS — R5383 Other fatigue: Secondary | ICD-10-CM | POA: Diagnosis not present

## 2021-12-31 DIAGNOSIS — F331 Major depressive disorder, recurrent, moderate: Secondary | ICD-10-CM | POA: Diagnosis not present

## 2021-12-31 DIAGNOSIS — Z23 Encounter for immunization: Secondary | ICD-10-CM | POA: Diagnosis not present

## 2021-12-31 DIAGNOSIS — I25119 Atherosclerotic heart disease of native coronary artery with unspecified angina pectoris: Secondary | ICD-10-CM | POA: Diagnosis not present

## 2022-01-14 DIAGNOSIS — D1801 Hemangioma of skin and subcutaneous tissue: Secondary | ICD-10-CM | POA: Diagnosis not present

## 2022-01-14 DIAGNOSIS — L570 Actinic keratosis: Secondary | ICD-10-CM | POA: Diagnosis not present

## 2022-01-14 DIAGNOSIS — L821 Other seborrheic keratosis: Secondary | ICD-10-CM | POA: Diagnosis not present

## 2022-01-14 DIAGNOSIS — L820 Inflamed seborrheic keratosis: Secondary | ICD-10-CM | POA: Diagnosis not present

## 2022-01-14 DIAGNOSIS — Z85820 Personal history of malignant melanoma of skin: Secondary | ICD-10-CM | POA: Diagnosis not present

## 2022-01-14 DIAGNOSIS — Z85828 Personal history of other malignant neoplasm of skin: Secondary | ICD-10-CM | POA: Diagnosis not present

## 2022-02-11 DIAGNOSIS — H179 Unspecified corneal scar and opacity: Secondary | ICD-10-CM | POA: Diagnosis not present

## 2022-02-11 DIAGNOSIS — Z9889 Other specified postprocedural states: Secondary | ICD-10-CM | POA: Diagnosis not present

## 2022-02-11 DIAGNOSIS — Z961 Presence of intraocular lens: Secondary | ICD-10-CM | POA: Diagnosis not present

## 2022-03-04 DIAGNOSIS — F331 Major depressive disorder, recurrent, moderate: Secondary | ICD-10-CM | POA: Diagnosis not present

## 2022-03-04 DIAGNOSIS — R5383 Other fatigue: Secondary | ICD-10-CM | POA: Diagnosis not present

## 2022-03-04 DIAGNOSIS — E291 Testicular hypofunction: Secondary | ICD-10-CM | POA: Diagnosis not present

## 2022-03-04 DIAGNOSIS — I25119 Atherosclerotic heart disease of native coronary artery with unspecified angina pectoris: Secondary | ICD-10-CM | POA: Diagnosis not present

## 2022-03-04 DIAGNOSIS — D510 Vitamin B12 deficiency anemia due to intrinsic factor deficiency: Secondary | ICD-10-CM | POA: Diagnosis not present

## 2022-03-09 DIAGNOSIS — Z23 Encounter for immunization: Secondary | ICD-10-CM | POA: Diagnosis not present

## 2022-03-19 DIAGNOSIS — Z23 Encounter for immunization: Secondary | ICD-10-CM | POA: Diagnosis not present

## 2022-05-05 DIAGNOSIS — I25119 Atherosclerotic heart disease of native coronary artery with unspecified angina pectoris: Secondary | ICD-10-CM | POA: Diagnosis not present

## 2022-05-05 DIAGNOSIS — D539 Nutritional anemia, unspecified: Secondary | ICD-10-CM | POA: Diagnosis not present

## 2022-05-05 DIAGNOSIS — D510 Vitamin B12 deficiency anemia due to intrinsic factor deficiency: Secondary | ICD-10-CM | POA: Diagnosis not present

## 2022-05-05 DIAGNOSIS — E291 Testicular hypofunction: Secondary | ICD-10-CM | POA: Diagnosis not present

## 2022-05-18 ENCOUNTER — Telehealth: Payer: Self-pay | Admitting: Interventional Cardiology

## 2022-05-18 NOTE — Telephone Encounter (Signed)
Pt c/o medication issue:  1. Name of Medication:   Alirocumab (Triana) 75 MG/ML SOAJ    2. How are you currently taking this medication (dosage and times per day)? As prescribed  3. Are you having a reaction (difficulty breathing--STAT)? No  4. What is your medication issue? Insurance no longer covers Praluent so he is needing to be switched to [place]. He is requesting this be sent to [facility] as a 90 day supply. Please advise.

## 2022-05-18 NOTE — Telephone Encounter (Signed)
Insurance still prefers Praluent through 06/07/22. PA request for Repatha currently states drug not covered. Will need to submit Repatha PA after January 1 and then send in rx to [facility]. Pt is aware and was appreciative for the call.

## 2022-05-28 ENCOUNTER — Telehealth: Payer: Self-pay

## 2022-05-28 NOTE — Telephone Encounter (Signed)
Patient called back and left a voicemail.  I called him back and he did not answer. I left a detailed message per DPR.

## 2022-05-28 NOTE — Telephone Encounter (Signed)
Plan doesn't cover [place] until 2024. Will need to wait until then to do PA and send Rx. I called pt to make him aware of this incase he was the one who requested [facility] reach out to [place]. We previously told him we had to wait until 2024. LVM for patient to call back.

## 2022-05-28 NOTE — Telephone Encounter (Signed)
[facility] pharmacy calling requesting a prescription for Repatha. Please address

## 2022-06-07 ENCOUNTER — Other Ambulatory Visit: Payer: Self-pay | Admitting: Interventional Cardiology

## 2022-06-07 DIAGNOSIS — E782 Mixed hyperlipidemia: Secondary | ICD-10-CM

## 2022-06-09 MED ORDER — REPATHA SURECLICK 140 MG/ML ~~LOC~~ SOAJ: 1.0000 | SUBCUTANEOUS | 3 refills | Status: AC

## 2022-06-09 NOTE — Telephone Encounter (Addendum)
Repatha PA submitted, key [id number]. Approved through 06/09/23. Refill sent to pharmacy, left message for pt to make him aware.

## 2022-06-09 NOTE — Addendum Note (Signed)
Addended by: Lilac Hoff E on: 06/09/2022 08:06 AM   Modules accepted: Orders

## 2022-06-29 DIAGNOSIS — C439 Malignant melanoma of skin, unspecified: Secondary | ICD-10-CM | POA: Diagnosis not present

## 2022-06-29 DIAGNOSIS — H60543 Acute eczematoid otitis externa, bilateral: Secondary | ICD-10-CM | POA: Diagnosis not present

## 2022-06-29 DIAGNOSIS — E538 Deficiency of other specified B group vitamins: Secondary | ICD-10-CM | POA: Diagnosis not present

## 2022-06-29 DIAGNOSIS — E559 Vitamin D deficiency, unspecified: Secondary | ICD-10-CM | POA: Diagnosis not present

## 2022-06-29 DIAGNOSIS — I2510 Atherosclerotic heart disease of native coronary artery without angina pectoris: Secondary | ICD-10-CM | POA: Diagnosis not present

## 2022-06-29 DIAGNOSIS — H9193 Unspecified hearing loss, bilateral: Secondary | ICD-10-CM | POA: Diagnosis not present

## 2022-06-29 DIAGNOSIS — M549 Dorsalgia, unspecified: Secondary | ICD-10-CM | POA: Diagnosis not present

## 2022-06-29 DIAGNOSIS — R351 Nocturia: Secondary | ICD-10-CM | POA: Diagnosis not present

## 2022-06-29 DIAGNOSIS — E785 Hyperlipidemia, unspecified: Secondary | ICD-10-CM | POA: Diagnosis not present

## 2022-06-29 DIAGNOSIS — E291 Testicular hypofunction: Secondary | ICD-10-CM | POA: Diagnosis not present

## 2022-07-22 DIAGNOSIS — E785 Hyperlipidemia, unspecified: Secondary | ICD-10-CM | POA: Diagnosis not present

## 2022-07-22 DIAGNOSIS — R351 Nocturia: Secondary | ICD-10-CM | POA: Diagnosis not present

## 2022-07-22 DIAGNOSIS — D519 Vitamin B12 deficiency anemia, unspecified: Secondary | ICD-10-CM | POA: Diagnosis not present

## 2022-07-22 DIAGNOSIS — E559 Vitamin D deficiency, unspecified: Secondary | ICD-10-CM | POA: Diagnosis not present

## 2022-07-22 DIAGNOSIS — E291 Testicular hypofunction: Secondary | ICD-10-CM | POA: Diagnosis not present

## 2022-07-29 DIAGNOSIS — Z681 Body mass index (BMI) 19 or less, adult: Secondary | ICD-10-CM | POA: Diagnosis not present

## 2022-07-29 DIAGNOSIS — Z7189 Other specified counseling: Secondary | ICD-10-CM | POA: Diagnosis not present

## 2022-07-29 DIAGNOSIS — I2510 Atherosclerotic heart disease of native coronary artery without angina pectoris: Secondary | ICD-10-CM | POA: Diagnosis not present

## 2022-07-29 DIAGNOSIS — Z1383 Encounter for screening for respiratory disorder NEC: Secondary | ICD-10-CM | POA: Diagnosis not present

## 2022-07-29 DIAGNOSIS — E291 Testicular hypofunction: Secondary | ICD-10-CM | POA: Diagnosis not present

## 2022-07-29 DIAGNOSIS — Z0001 Encounter for general adult medical examination with abnormal findings: Secondary | ICD-10-CM | POA: Diagnosis not present

## 2022-07-29 DIAGNOSIS — H9193 Unspecified hearing loss, bilateral: Secondary | ICD-10-CM | POA: Diagnosis not present

## 2022-07-29 DIAGNOSIS — E538 Deficiency of other specified B group vitamins: Secondary | ICD-10-CM | POA: Diagnosis not present

## 2022-07-29 DIAGNOSIS — H60543 Acute eczematoid otitis externa, bilateral: Secondary | ICD-10-CM | POA: Diagnosis not present

## 2022-07-29 DIAGNOSIS — M549 Dorsalgia, unspecified: Secondary | ICD-10-CM | POA: Diagnosis not present

## 2022-07-29 DIAGNOSIS — E785 Hyperlipidemia, unspecified: Secondary | ICD-10-CM | POA: Diagnosis not present

## 2022-07-29 DIAGNOSIS — Z1389 Encounter for screening for other disorder: Secondary | ICD-10-CM | POA: Diagnosis not present

## 2022-07-29 DIAGNOSIS — R351 Nocturia: Secondary | ICD-10-CM | POA: Diagnosis not present

## 2022-07-29 DIAGNOSIS — C439 Malignant melanoma of skin, unspecified: Secondary | ICD-10-CM | POA: Diagnosis not present

## 2022-07-29 DIAGNOSIS — Z6821 Body mass index (BMI) 21.0-21.9, adult: Secondary | ICD-10-CM | POA: Diagnosis not present

## 2022-07-29 DIAGNOSIS — Z1331 Encounter for screening for depression: Secondary | ICD-10-CM | POA: Diagnosis not present

## 2022-07-29 DIAGNOSIS — E559 Vitamin D deficiency, unspecified: Secondary | ICD-10-CM | POA: Diagnosis not present

## 2022-10-08 DIAGNOSIS — D519 Vitamin B12 deficiency anemia, unspecified: Secondary | ICD-10-CM | POA: Diagnosis not present

## 2022-10-08 DIAGNOSIS — E782 Mixed hyperlipidemia: Secondary | ICD-10-CM | POA: Diagnosis not present

## 2022-10-08 DIAGNOSIS — E559 Vitamin D deficiency, unspecified: Secondary | ICD-10-CM | POA: Diagnosis not present

## 2022-10-14 DIAGNOSIS — H04123 Dry eye syndrome of bilateral lacrimal glands: Secondary | ICD-10-CM | POA: Diagnosis not present

## 2022-12-09 DIAGNOSIS — J301 Allergic rhinitis due to pollen: Secondary | ICD-10-CM | POA: Diagnosis not present

## 2022-12-09 DIAGNOSIS — H1045 Other chronic allergic conjunctivitis: Secondary | ICD-10-CM | POA: Diagnosis not present

## 2022-12-09 DIAGNOSIS — Z880 Allergy status to penicillin: Secondary | ICD-10-CM | POA: Diagnosis not present

## 2023-01-02 DIAGNOSIS — Z23 Encounter for immunization: Secondary | ICD-10-CM | POA: Diagnosis not present

## 2023-01-20 DIAGNOSIS — L570 Actinic keratosis: Secondary | ICD-10-CM | POA: Diagnosis not present

## 2023-01-20 DIAGNOSIS — D1801 Hemangioma of skin and subcutaneous tissue: Secondary | ICD-10-CM | POA: Diagnosis not present

## 2023-01-20 DIAGNOSIS — L821 Other seborrheic keratosis: Secondary | ICD-10-CM | POA: Diagnosis not present

## 2023-01-20 DIAGNOSIS — Z85820 Personal history of malignant melanoma of skin: Secondary | ICD-10-CM | POA: Diagnosis not present

## 2023-01-20 DIAGNOSIS — Z85828 Personal history of other malignant neoplasm of skin: Secondary | ICD-10-CM | POA: Diagnosis not present

## 2023-02-01 DIAGNOSIS — Z1331 Encounter for screening for depression: Secondary | ICD-10-CM | POA: Diagnosis not present

## 2023-02-01 DIAGNOSIS — F331 Major depressive disorder, recurrent, moderate: Secondary | ICD-10-CM | POA: Diagnosis not present

## 2023-02-01 DIAGNOSIS — E291 Testicular hypofunction: Secondary | ICD-10-CM | POA: Diagnosis not present

## 2023-02-01 DIAGNOSIS — Z0000 Encounter for general adult medical examination without abnormal findings: Secondary | ICD-10-CM | POA: Diagnosis not present

## 2023-02-01 DIAGNOSIS — Z23 Encounter for immunization: Secondary | ICD-10-CM | POA: Diagnosis not present

## 2023-02-01 DIAGNOSIS — R5383 Other fatigue: Secondary | ICD-10-CM | POA: Diagnosis not present

## 2023-02-01 DIAGNOSIS — E669 Obesity, unspecified: Secondary | ICD-10-CM | POA: Diagnosis not present

## 2023-02-01 DIAGNOSIS — Z79899 Other long term (current) drug therapy: Secondary | ICD-10-CM | POA: Diagnosis not present

## 2023-02-01 DIAGNOSIS — E7800 Pure hypercholesterolemia, unspecified: Secondary | ICD-10-CM | POA: Diagnosis not present

## 2023-02-01 DIAGNOSIS — D510 Vitamin B12 deficiency anemia due to intrinsic factor deficiency: Secondary | ICD-10-CM | POA: Diagnosis not present

## 2023-02-01 DIAGNOSIS — I25119 Atherosclerotic heart disease of native coronary artery with unspecified angina pectoris: Secondary | ICD-10-CM | POA: Diagnosis not present

## 2023-03-27 DIAGNOSIS — Z23 Encounter for immunization: Secondary | ICD-10-CM | POA: Diagnosis not present

## 2023-05-03 ENCOUNTER — Other Ambulatory Visit: Payer: Self-pay | Admitting: Interventional Cardiology

## 2023-05-03 DIAGNOSIS — I2510 Atherosclerotic heart disease of native coronary artery without angina pectoris: Secondary | ICD-10-CM

## 2023-05-03 DIAGNOSIS — E782 Mixed hyperlipidemia: Secondary | ICD-10-CM

## 2023-05-21 DIAGNOSIS — Z961 Presence of intraocular lens: Secondary | ICD-10-CM | POA: Diagnosis not present

## 2023-05-21 DIAGNOSIS — H02002 Unspecified entropion of right lower eyelid: Secondary | ICD-10-CM | POA: Diagnosis not present

## 2023-05-21 DIAGNOSIS — Z9889 Other specified postprocedural states: Secondary | ICD-10-CM | POA: Diagnosis not present

## 2023-05-21 DIAGNOSIS — H179 Unspecified corneal scar and opacity: Secondary | ICD-10-CM | POA: Diagnosis not present

## 2023-05-24 DIAGNOSIS — D485 Neoplasm of uncertain behavior of skin: Secondary | ICD-10-CM | POA: Diagnosis not present

## 2023-05-24 DIAGNOSIS — Z85820 Personal history of malignant melanoma of skin: Secondary | ICD-10-CM | POA: Diagnosis not present

## 2023-05-24 DIAGNOSIS — L821 Other seborrheic keratosis: Secondary | ICD-10-CM | POA: Diagnosis not present

## 2023-05-24 DIAGNOSIS — D044 Carcinoma in situ of skin of scalp and neck: Secondary | ICD-10-CM | POA: Diagnosis not present

## 2023-05-24 DIAGNOSIS — Z85828 Personal history of other malignant neoplasm of skin: Secondary | ICD-10-CM | POA: Diagnosis not present

## 2023-06-23 DIAGNOSIS — H02002 Unspecified entropion of right lower eyelid: Secondary | ICD-10-CM | POA: Diagnosis not present

## 2023-07-06 DIAGNOSIS — R233 Spontaneous ecchymoses: Secondary | ICD-10-CM | POA: Diagnosis not present

## 2023-07-06 DIAGNOSIS — F329 Major depressive disorder, single episode, unspecified: Secondary | ICD-10-CM | POA: Diagnosis not present

## 2023-07-06 DIAGNOSIS — Z01810 Encounter for preprocedural cardiovascular examination: Secondary | ICD-10-CM | POA: Diagnosis not present

## 2023-07-06 DIAGNOSIS — E782 Mixed hyperlipidemia: Secondary | ICD-10-CM | POA: Diagnosis not present

## 2023-07-06 DIAGNOSIS — R351 Nocturia: Secondary | ICD-10-CM | POA: Diagnosis not present

## 2023-07-06 DIAGNOSIS — N390 Urinary tract infection, site not specified: Secondary | ICD-10-CM | POA: Diagnosis not present

## 2023-07-06 DIAGNOSIS — I2510 Atherosclerotic heart disease of native coronary artery without angina pectoris: Secondary | ICD-10-CM | POA: Diagnosis not present

## 2023-07-06 DIAGNOSIS — H02001 Unspecified entropion of right upper eyelid: Secondary | ICD-10-CM | POA: Diagnosis not present

## 2023-07-06 DIAGNOSIS — Z01818 Encounter for other preprocedural examination: Secondary | ICD-10-CM | POA: Diagnosis not present

## 2023-07-06 DIAGNOSIS — H9193 Unspecified hearing loss, bilateral: Secondary | ICD-10-CM | POA: Diagnosis not present

## 2023-07-06 DIAGNOSIS — E291 Testicular hypofunction: Secondary | ICD-10-CM | POA: Diagnosis not present

## 2023-08-02 DIAGNOSIS — H6122 Impacted cerumen, left ear: Secondary | ICD-10-CM | POA: Diagnosis not present

## 2023-08-02 DIAGNOSIS — R351 Nocturia: Secondary | ICD-10-CM | POA: Diagnosis not present

## 2023-08-02 DIAGNOSIS — T162XXA Foreign body in left ear, initial encounter: Secondary | ICD-10-CM | POA: Diagnosis not present

## 2023-08-09 DIAGNOSIS — D519 Vitamin B12 deficiency anemia, unspecified: Secondary | ICD-10-CM | POA: Diagnosis not present

## 2023-08-09 DIAGNOSIS — E559 Vitamin D deficiency, unspecified: Secondary | ICD-10-CM | POA: Diagnosis not present

## 2023-08-09 DIAGNOSIS — E785 Hyperlipidemia, unspecified: Secondary | ICD-10-CM | POA: Diagnosis not present

## 2023-08-09 DIAGNOSIS — R351 Nocturia: Secondary | ICD-10-CM | POA: Diagnosis not present

## 2023-08-09 DIAGNOSIS — E291 Testicular hypofunction: Secondary | ICD-10-CM | POA: Diagnosis not present

## 2023-08-10 LAB — LAB REPORT - SCANNED
EGFR: 69
PSA, Total: 2.55

## 2023-08-19 DIAGNOSIS — I2510 Atherosclerotic heart disease of native coronary artery without angina pectoris: Secondary | ICD-10-CM | POA: Diagnosis not present

## 2023-08-19 DIAGNOSIS — H9193 Unspecified hearing loss, bilateral: Secondary | ICD-10-CM | POA: Diagnosis not present

## 2023-08-19 DIAGNOSIS — R351 Nocturia: Secondary | ICD-10-CM | POA: Diagnosis not present

## 2023-08-19 DIAGNOSIS — Z85828 Personal history of other malignant neoplasm of skin: Secondary | ICD-10-CM | POA: Diagnosis not present

## 2023-08-19 DIAGNOSIS — E291 Testicular hypofunction: Secondary | ICD-10-CM | POA: Diagnosis not present

## 2023-08-19 DIAGNOSIS — Z6824 Body mass index (BMI) 24.0-24.9, adult: Secondary | ICD-10-CM | POA: Diagnosis not present

## 2023-08-19 DIAGNOSIS — M549 Dorsalgia, unspecified: Secondary | ICD-10-CM | POA: Diagnosis not present

## 2023-08-19 DIAGNOSIS — H60543 Acute eczematoid otitis externa, bilateral: Secondary | ICD-10-CM | POA: Diagnosis not present

## 2023-08-19 DIAGNOSIS — E538 Deficiency of other specified B group vitamins: Secondary | ICD-10-CM | POA: Diagnosis not present

## 2023-08-19 DIAGNOSIS — E559 Vitamin D deficiency, unspecified: Secondary | ICD-10-CM | POA: Diagnosis not present

## 2023-09-06 DIAGNOSIS — I25119 Atherosclerotic heart disease of native coronary artery with unspecified angina pectoris: Secondary | ICD-10-CM | POA: Diagnosis not present

## 2023-09-06 DIAGNOSIS — E669 Obesity, unspecified: Secondary | ICD-10-CM | POA: Diagnosis not present

## 2023-09-06 DIAGNOSIS — E7800 Pure hypercholesterolemia, unspecified: Secondary | ICD-10-CM | POA: Diagnosis not present

## 2023-09-06 DIAGNOSIS — F331 Major depressive disorder, recurrent, moderate: Secondary | ICD-10-CM | POA: Diagnosis not present

## 2023-10-27 DIAGNOSIS — J301 Allergic rhinitis due to pollen: Secondary | ICD-10-CM | POA: Diagnosis not present

## 2023-10-27 DIAGNOSIS — Z880 Allergy status to penicillin: Secondary | ICD-10-CM | POA: Diagnosis not present

## 2023-10-27 DIAGNOSIS — H1045 Other chronic allergic conjunctivitis: Secondary | ICD-10-CM | POA: Diagnosis not present

## 2023-11-23 DIAGNOSIS — L570 Actinic keratosis: Secondary | ICD-10-CM | POA: Diagnosis not present

## 2023-11-23 DIAGNOSIS — Z85820 Personal history of malignant melanoma of skin: Secondary | ICD-10-CM | POA: Diagnosis not present

## 2023-11-23 DIAGNOSIS — L820 Inflamed seborrheic keratosis: Secondary | ICD-10-CM | POA: Diagnosis not present

## 2023-11-23 DIAGNOSIS — L821 Other seborrheic keratosis: Secondary | ICD-10-CM | POA: Diagnosis not present

## 2023-11-23 DIAGNOSIS — L812 Freckles: Secondary | ICD-10-CM | POA: Diagnosis not present

## 2023-11-23 DIAGNOSIS — Z85828 Personal history of other malignant neoplasm of skin: Secondary | ICD-10-CM | POA: Diagnosis not present

## 2023-11-23 DIAGNOSIS — D1801 Hemangioma of skin and subcutaneous tissue: Secondary | ICD-10-CM | POA: Diagnosis not present

## 2023-12-14 ENCOUNTER — Ambulatory Visit: Attending: Cardiovascular Disease | Admitting: Cardiovascular Disease

## 2023-12-14 ENCOUNTER — Encounter: Payer: Self-pay | Admitting: Cardiovascular Disease

## 2023-12-14 VITALS — BP 134/70 | HR 67 | Ht 68.0 in | Wt 160.8 lb

## 2023-12-14 DIAGNOSIS — I4510 Unspecified right bundle-branch block: Secondary | ICD-10-CM | POA: Diagnosis present

## 2023-12-14 DIAGNOSIS — E782 Mixed hyperlipidemia: Secondary | ICD-10-CM | POA: Diagnosis present

## 2023-12-14 DIAGNOSIS — I2510 Atherosclerotic heart disease of native coronary artery without angina pectoris: Secondary | ICD-10-CM | POA: Diagnosis present

## 2023-12-14 NOTE — Assessment & Plan Note (Signed)
 History of CAD status post CABG x 2 by Dr. Army in 2012.  He has not had a cardiac catheterization since.  Stress test performed 01/31/2020 was nonischemic.  He denies chest pain or shortness of breath.  He is fairly active.

## 2023-12-14 NOTE — Assessment & Plan Note (Signed)
 Chronic

## 2023-12-14 NOTE — Patient Instructions (Signed)

## 2023-12-14 NOTE — Progress Notes (Signed)
 12/14/2023 Charles Grant   11-24-1933  [id number]  Primary Physician Delice Charleston, MD (Inactive) Primary Cardiologist: Dorn JINNY Lesches MD GENI SIX, [place], FSCAI  HPI:  Charles Grant is a 89 y.o. mildly overweight married Caucasian male father of 4, grandfather of 6 grandchildren formally a patient of Dr. Johnice.  I am assuming his care in Dr. Johnice absence.  He is retired from being a Radiographer, therapeutic.  His risk factor profile is notable for treated hyperlipidemia.  He has never smoked.  There is no family history of heart disease.  He is never had a heart attack or stroke.  He did have CABG x 2 by Dr. Army 12/12 and a negative Myoview  stress test 01/31/2020.  He is fairly active and walks 30 minutes a day 5 days a week, does stretches and push-ups.  He is completely asymptomatic.   Current Meds  Medication Sig   aspirin  EC 81 MG tablet Take 1 tablet (81 mg total) by mouth daily. Swallow whole.   Cholecalciferol (VITAMIN D ) 1000 UNITS capsule 5,000 UNTIS DAILY   Evolocumab  (REPATHA  SURECLICK) 140 MG/ML SOAJ Inject 140 mg into the skin every 14 (fourteen) days.   fexofenadine (ALLEGRA) 180 MG tablet Take 180 mg by mouth daily. (Patient taking differently: Take 180 mg by mouth as needed.)   FLUoxetine  (PROZAC ) 20 MG capsule Take 60 mg by mouth daily.   Multiple Vitamin (MULTIVITAMIN WITH MINERALS) TABS tablet Take 1 tablet by mouth daily.   valACYclovir (VALTREX) 1000 MG tablet Take 1,000 mg by mouth daily.     Allergies  Allergen Reactions   Penicillins Hives, Swelling and Rash    Has patient had a PCN reaction causing immediate rash, facial/tongue/throat swelling, SOB or lightheadedness with hypotension:No Has patient had a PCN reaction causing severe rash involving mucus membranes or skin necrosis:No Has patient had a PCN reaction that required hospitalization:No Has patient had a PCN reaction occurring within the last 10 years:No If all of the  above answers are NO, then may proceed with Cephalosporin use.    Montelukast Sodium Other (See Comments)    When mixed with antidepressants there is a side effect    Social History   Socioeconomic History   Marital status: Married    Spouse name: Not on file   Number of children: Not on file   Years of education: Not on file   Highest education level: Not on file  Occupational History   Not on file  Tobacco Use   Smoking status: Never   Smokeless tobacco: Never  Substance and Sexual Activity   Alcohol use: Yes    Alcohol/week: 10.0 standard drinks of alcohol    Types: 10 Glasses of wine per week    Comment: 2 - 3 glasses of wine daily   Drug use: No   Sexual activity: Yes  Other Topics Concern   Not on file  Social History Narrative   Not on file   Social Drivers of Health   Financial Resource Strain: Not on file  Food Insecurity: Not on file  Transportation Needs: Not on file  Physical Activity: Not on file  Stress: Not on file  Social Connections: Not on file  Intimate Partner Violence: Not on file     Review of Systems: General: negative for chills, fever, night sweats or weight changes.  Cardiovascular: negative for chest pain, dyspnea on exertion, edema, orthopnea, palpitations, paroxysmal nocturnal dyspnea or shortness of breath Dermatological: negative  for rash Respiratory: negative for cough or wheezing Urologic: negative for hematuria Abdominal: negative for nausea, vomiting, diarrhea, bright red blood per rectum, melena, or hematemesis Neurologic: negative for visual changes, syncope, or dizziness All other systems reviewed and are otherwise negative except as noted above.    Blood pressure 134/70, pulse 67, height 5' 8 (1.727 m), weight 160 lb 12.8 oz (72.9 kg), SpO2 94%.  General appearance: alert and no distress Neck: no adenopathy, no carotid bruit, no JVD, supple, symmetrical, trachea midline, and thyroid  not enlarged, symmetric, no  tenderness/mass/nodules Lungs: clear to auscultation bilaterally Heart: regular rate and rhythm, S1, S2 normal, no murmur, click, rub or gallop Extremities: extremities normal, atraumatic, no cyanosis or edema Pulses: 2+ and symmetric Skin: Skin color, texture, turgor normal. No rashes or lesions Neurologic: Grossly normal  EKG EKG Interpretation Date/Time:  Tuesday December 14 2023 13:32:22 EDT Ventricular Rate:  67 PR Interval:  264 QRS Duration:  126 QT Interval:  428 QTC Calculation: 452 R Axis:   -62  Text Interpretation: Sinus rhythm with 1st degree A-V block Right bundle branch block Left anterior fascicular block Bifascicular block When compared with ECG of 23-Mar-2016 11:06, Right bundle branch block is now Present Confirmed by Court Carrier [phone]) on 12/14/2023 1:38:23 PM    ASSESSMENT AND PLAN:   Coronary artery disease History of CAD status post CABG x 2 by Dr. Army in 2012.  He has not had a cardiac catheterization since.  Stress test performed 01/31/2020 was nonischemic.  He denies chest pain or shortness of breath.  He is fairly active.  Mixed hyperlipidemia History of hyperlipidemia intolerant to statins and Zetia on Repatha  with lipid profile performed 02/01/2023 revealing total cholesterol 126, LDL 37 and HDL of 62.  Right bundle branch block Chronic     Carrier DOROTHA Court MD [facility], [facility] 12/14/2023 1:49 PM

## 2023-12-14 NOTE — Assessment & Plan Note (Signed)
 History of hyperlipidemia intolerant to statins and Zetia on Repatha  with lipid profile performed 02/01/2023 revealing total cholesterol 126, LDL 37 and HDL of 62.

## 2024-02-04 DIAGNOSIS — F331 Major depressive disorder, recurrent, moderate: Secondary | ICD-10-CM | POA: Diagnosis not present

## 2024-02-04 DIAGNOSIS — R54 Age-related physical debility: Secondary | ICD-10-CM | POA: Diagnosis not present

## 2024-02-04 DIAGNOSIS — E291 Testicular hypofunction: Secondary | ICD-10-CM | POA: Diagnosis not present

## 2024-02-04 DIAGNOSIS — E7800 Pure hypercholesterolemia, unspecified: Secondary | ICD-10-CM | POA: Diagnosis not present

## 2024-02-04 DIAGNOSIS — I25119 Atherosclerotic heart disease of native coronary artery with unspecified angina pectoris: Secondary | ICD-10-CM | POA: Diagnosis not present

## 2024-02-04 DIAGNOSIS — Z0000 Encounter for general adult medical examination without abnormal findings: Secondary | ICD-10-CM | POA: Diagnosis not present

## 2024-02-04 DIAGNOSIS — D510 Vitamin B12 deficiency anemia due to intrinsic factor deficiency: Secondary | ICD-10-CM | POA: Diagnosis not present

## 2024-02-04 DIAGNOSIS — Z79899 Other long term (current) drug therapy: Secondary | ICD-10-CM | POA: Diagnosis not present

## 2024-02-04 DIAGNOSIS — Z1331 Encounter for screening for depression: Secondary | ICD-10-CM | POA: Diagnosis not present

## 2024-02-08 ENCOUNTER — Ambulatory Visit: Payer: Self-pay | Admitting: Cardiovascular Disease

## 2024-02-16 DIAGNOSIS — H02002 Unspecified entropion of right lower eyelid: Secondary | ICD-10-CM | POA: Diagnosis not present

## 2024-02-16 DIAGNOSIS — Z9889 Other specified postprocedural states: Secondary | ICD-10-CM | POA: Diagnosis not present

## 2024-02-16 DIAGNOSIS — Z961 Presence of intraocular lens: Secondary | ICD-10-CM | POA: Diagnosis not present

## 2024-02-16 DIAGNOSIS — H179 Unspecified corneal scar and opacity: Secondary | ICD-10-CM | POA: Diagnosis not present

## 2024-03-09 DIAGNOSIS — Z23 Encounter for immunization: Secondary | ICD-10-CM | POA: Diagnosis not present

## 2024-04-11 DIAGNOSIS — E291 Testicular hypofunction: Secondary | ICD-10-CM | POA: Diagnosis not present

## 2024-06-20 LAB — LAB REPORT - SCANNED: EGFR: 75

## 2024-07-05 ENCOUNTER — Ambulatory Visit: Payer: Self-pay | Admitting: Cardiovascular Disease
